# Patient Record
Sex: Female | Born: 1935 | Race: White | Hispanic: No | Smoking: Current some day smoker
Health system: Southern US, Community
[De-identification: ages and names within clinical notes are randomized; demographics above are authoritative.]

## PROBLEM LIST (undated history)

## (undated) DIAGNOSIS — C069 Malignant neoplasm of mouth, unspecified: Secondary | ICD-10-CM

## (undated) DIAGNOSIS — I10 Essential (primary) hypertension: Secondary | ICD-10-CM

## (undated) HISTORY — PX: APPENDECTOMY: SHX54

## (undated) HISTORY — PX: TONSILLECTOMY: SUR1361

---

## 1997-06-08 ENCOUNTER — Ambulatory Visit (HOSPITAL_COMMUNITY): Admission: RE | Admit: 1997-06-08 | Discharge: 1997-06-08 | Payer: Self-pay | Admitting: *Deleted

## 1997-07-06 ENCOUNTER — Ambulatory Visit (HOSPITAL_COMMUNITY): Admission: RE | Admit: 1997-07-06 | Discharge: 1997-07-06 | Payer: Self-pay | Admitting: Cardiology

## 1998-09-27 ENCOUNTER — Other Ambulatory Visit: Admission: RE | Admit: 1998-09-27 | Discharge: 1998-09-27 | Payer: Self-pay | Admitting: Family Medicine

## 2002-05-25 ENCOUNTER — Encounter: Payer: Self-pay | Admitting: Family Medicine

## 2002-05-25 ENCOUNTER — Encounter: Admission: RE | Admit: 2002-05-25 | Discharge: 2002-05-25 | Payer: Self-pay | Admitting: Family Medicine

## 2003-11-27 ENCOUNTER — Ambulatory Visit: Payer: Self-pay | Admitting: Family Medicine

## 2004-03-05 ENCOUNTER — Ambulatory Visit: Payer: Self-pay | Admitting: Family Medicine

## 2004-04-30 ENCOUNTER — Ambulatory Visit: Payer: Self-pay | Admitting: Family Medicine

## 2004-08-06 ENCOUNTER — Ambulatory Visit: Payer: Self-pay | Admitting: Family Medicine

## 2004-11-13 ENCOUNTER — Ambulatory Visit: Payer: Self-pay | Admitting: Family Medicine

## 2005-02-20 ENCOUNTER — Ambulatory Visit: Payer: Self-pay | Admitting: Family Medicine

## 2005-08-05 ENCOUNTER — Ambulatory Visit: Payer: Self-pay | Admitting: Family Medicine

## 2005-11-12 ENCOUNTER — Ambulatory Visit: Payer: Self-pay | Admitting: Family Medicine

## 2006-02-19 ENCOUNTER — Ambulatory Visit: Payer: Self-pay | Admitting: Family Medicine

## 2006-05-28 ENCOUNTER — Ambulatory Visit: Payer: Self-pay | Admitting: Family Medicine

## 2015-09-20 ENCOUNTER — Emergency Department (HOSPITAL_COMMUNITY): Payer: Medicare Other

## 2015-09-20 ENCOUNTER — Encounter (HOSPITAL_COMMUNITY): Payer: Self-pay | Admitting: Emergency Medicine

## 2015-09-20 ENCOUNTER — Inpatient Hospital Stay (HOSPITAL_COMMUNITY): Payer: Medicare Other

## 2015-09-20 ENCOUNTER — Inpatient Hospital Stay (HOSPITAL_COMMUNITY)
Admission: EM | Admit: 2015-09-20 | Discharge: 2015-09-25 | DRG: 871 | Disposition: A | Discharge status: Home / Self Care | Payer: Medicare Other | Attending: Internal Medicine | Admitting: Internal Medicine

## 2015-09-20 DIAGNOSIS — R7881 Bacteremia: Secondary | ICD-10-CM | POA: Diagnosis not present

## 2015-09-20 DIAGNOSIS — Z79899 Other long term (current) drug therapy: Secondary | ICD-10-CM | POA: Diagnosis not present

## 2015-09-20 DIAGNOSIS — Z888 Allergy status to other drugs, medicaments and biological substances status: Secondary | ICD-10-CM

## 2015-09-20 DIAGNOSIS — K838 Other specified diseases of biliary tract: Secondary | ICD-10-CM | POA: Diagnosis not present

## 2015-09-20 DIAGNOSIS — J9601 Acute respiratory failure with hypoxia: Secondary | ICD-10-CM | POA: Diagnosis present

## 2015-09-20 DIAGNOSIS — C801 Malignant (primary) neoplasm, unspecified: Secondary | ICD-10-CM

## 2015-09-20 DIAGNOSIS — L8990 Pressure ulcer of unspecified site, unspecified stage: Secondary | ICD-10-CM | POA: Diagnosis present

## 2015-09-20 DIAGNOSIS — D72829 Elevated white blood cell count, unspecified: Secondary | ICD-10-CM

## 2015-09-20 DIAGNOSIS — Z882 Allergy status to sulfonamides status: Secondary | ICD-10-CM | POA: Diagnosis not present

## 2015-09-20 DIAGNOSIS — E43 Unspecified severe protein-calorie malnutrition: Secondary | ICD-10-CM | POA: Diagnosis present

## 2015-09-20 DIAGNOSIS — E039 Hypothyroidism, unspecified: Secondary | ICD-10-CM | POA: Diagnosis present

## 2015-09-20 DIAGNOSIS — B9620 Unspecified Escherichia coli [E. coli] as the cause of diseases classified elsewhere: Secondary | ICD-10-CM | POA: Diagnosis present

## 2015-09-20 DIAGNOSIS — I5189 Other ill-defined heart diseases: Secondary | ICD-10-CM | POA: Diagnosis present

## 2015-09-20 DIAGNOSIS — N179 Acute kidney failure, unspecified: Secondary | ICD-10-CM | POA: Diagnosis present

## 2015-09-20 DIAGNOSIS — K8020 Calculus of gallbladder without cholecystitis without obstruction: Secondary | ICD-10-CM | POA: Diagnosis present

## 2015-09-20 DIAGNOSIS — R7989 Other specified abnormal findings of blood chemistry: Secondary | ICD-10-CM | POA: Diagnosis present

## 2015-09-20 DIAGNOSIS — Z85810 Personal history of malignant neoplasm of tongue: Secondary | ICD-10-CM

## 2015-09-20 DIAGNOSIS — A419 Sepsis, unspecified organism: Secondary | ICD-10-CM | POA: Diagnosis present

## 2015-09-20 DIAGNOSIS — R918 Other nonspecific abnormal finding of lung field: Secondary | ICD-10-CM | POA: Diagnosis not present

## 2015-09-20 DIAGNOSIS — E119 Type 2 diabetes mellitus without complications: Secondary | ICD-10-CM | POA: Diagnosis present

## 2015-09-20 DIAGNOSIS — Z923 Personal history of irradiation: Secondary | ICD-10-CM | POA: Diagnosis not present

## 2015-09-20 DIAGNOSIS — Z87891 Personal history of nicotine dependence: Secondary | ICD-10-CM

## 2015-09-20 DIAGNOSIS — I10 Essential (primary) hypertension: Secondary | ICD-10-CM | POA: Diagnosis present

## 2015-09-20 DIAGNOSIS — N390 Urinary tract infection, site not specified: Secondary | ICD-10-CM | POA: Diagnosis present

## 2015-09-20 DIAGNOSIS — Z23 Encounter for immunization: Secondary | ICD-10-CM

## 2015-09-20 DIAGNOSIS — Z681 Body mass index (BMI) 19 or less, adult: Secondary | ICD-10-CM | POA: Diagnosis not present

## 2015-09-20 DIAGNOSIS — K7689 Other specified diseases of liver: Secondary | ICD-10-CM | POA: Diagnosis present

## 2015-09-20 DIAGNOSIS — I700 Atherosclerosis of aorta: Secondary | ICD-10-CM

## 2015-09-20 DIAGNOSIS — R0600 Dyspnea, unspecified: Secondary | ICD-10-CM | POA: Diagnosis not present

## 2015-09-20 DIAGNOSIS — I959 Hypotension, unspecified: Secondary | ICD-10-CM | POA: Diagnosis present

## 2015-09-20 DIAGNOSIS — R64 Cachexia: Secondary | ICD-10-CM | POA: Diagnosis present

## 2015-09-20 DIAGNOSIS — I480 Paroxysmal atrial fibrillation: Secondary | ICD-10-CM

## 2015-09-20 DIAGNOSIS — K7460 Unspecified cirrhosis of liver: Secondary | ICD-10-CM

## 2015-09-20 DIAGNOSIS — J189 Pneumonia, unspecified organism: Secondary | ICD-10-CM | POA: Diagnosis present

## 2015-09-20 DIAGNOSIS — C3490 Malignant neoplasm of unspecified part of unspecified bronchus or lung: Secondary | ICD-10-CM

## 2015-09-20 DIAGNOSIS — R0902 Hypoxemia: Secondary | ICD-10-CM

## 2015-09-20 HISTORY — DX: Malignant neoplasm of mouth, unspecified: C06.9

## 2015-09-20 HISTORY — DX: Essential (primary) hypertension: I10

## 2015-09-20 LAB — CBC WITH DIFFERENTIAL/PLATELET
BASOS ABS: 0 10*3/uL (ref 0.0–0.1)
BASOS PCT: 0 %
Eosinophils Absolute: 0 10*3/uL (ref 0.0–0.7)
Eosinophils Relative: 0 %
HEMATOCRIT: 46.3 % — AB (ref 36.0–46.0)
HEMOGLOBIN: 15.2 g/dL — AB (ref 12.0–15.0)
Lymphocytes Relative: 4 %
Lymphs Abs: 0.6 10*3/uL — ABNORMAL LOW (ref 0.7–4.0)
MCH: 30.7 pg (ref 26.0–34.0)
MCHC: 32.8 g/dL (ref 30.0–36.0)
MCV: 93.5 fL (ref 78.0–100.0)
MONOS PCT: 6 %
Monocytes Absolute: 0.8 10*3/uL (ref 0.1–1.0)
NEUTROS ABS: 12.2 10*3/uL — AB (ref 1.7–7.7)
NEUTROS PCT: 90 %
Platelets: 171 10*3/uL (ref 150–400)
RBC: 4.95 MIL/uL (ref 3.87–5.11)
RDW: 13.9 % (ref 11.5–15.5)
WBC: 13.6 10*3/uL — AB (ref 4.0–10.5)

## 2015-09-20 LAB — I-STAT VENOUS BLOOD GAS, ED
Acid-Base Excess: 7 mmol/L — ABNORMAL HIGH (ref 0.0–2.0)
Bicarbonate: 33.8 mmol/L — ABNORMAL HIGH (ref 20.0–28.0)
O2 Saturation: 38 %
PCO2 VEN: 52.8 mmHg (ref 44.0–60.0)
PH VEN: 7.414 (ref 7.250–7.430)
TCO2: 35 mmol/L (ref 0–100)
pO2, Ven: 23 mmHg — CL (ref 32.0–45.0)

## 2015-09-20 LAB — COMPREHENSIVE METABOLIC PANEL
ALK PHOS: 74 U/L (ref 38–126)
ALT: 13 U/L — ABNORMAL LOW (ref 14–54)
ANION GAP: 9 (ref 5–15)
AST: 20 U/L (ref 15–41)
Albumin: 2.8 g/dL — ABNORMAL LOW (ref 3.5–5.0)
BUN: 58 mg/dL — ABNORMAL HIGH (ref 6–20)
CALCIUM: 9.8 mg/dL (ref 8.9–10.3)
CHLORIDE: 99 mmol/L — AB (ref 101–111)
CO2: 31 mmol/L (ref 22–32)
Creatinine, Ser: 1.3 mg/dL — ABNORMAL HIGH (ref 0.44–1.00)
GFR calc non Af Amer: 38 mL/min — ABNORMAL LOW (ref >60)
GFR, EST AFRICAN AMERICAN: 44 mL/min — AB (ref >60)
Glucose, Bld: 112 mg/dL — ABNORMAL HIGH (ref 65–99)
Potassium: 3.6 mmol/L (ref 3.5–5.1)
SODIUM: 139 mmol/L (ref 135–145)
Total Bilirubin: 0.7 mg/dL (ref 0.3–1.2)
Total Protein: 7.2 g/dL (ref 6.5–8.1)

## 2015-09-20 LAB — URINALYSIS, ROUTINE W REFLEX MICROSCOPIC
Bilirubin Urine: NEGATIVE
Glucose, UA: NEGATIVE mg/dL
Ketones, ur: NEGATIVE mg/dL
NITRITE: NEGATIVE
PH: 6.5 (ref 5.0–8.0)
Protein, ur: 30 mg/dL — AB
SPECIFIC GRAVITY, URINE: 1.025 (ref 1.005–1.030)

## 2015-09-20 LAB — I-STAT TROPONIN, ED: TROPONIN I, POC: 0.03 ng/mL (ref 0.00–0.08)

## 2015-09-20 LAB — D-DIMER, QUANTITATIVE (NOT AT ARMC): D DIMER QUANT: 4.3 ug{FEU}/mL — AB (ref 0.00–0.50)

## 2015-09-20 LAB — URINE MICROSCOPIC-ADD ON

## 2015-09-20 LAB — PROCALCITONIN: Procalcitonin: 3.38 ng/mL

## 2015-09-20 LAB — I-STAT CG4 LACTIC ACID, ED
LACTIC ACID, VENOUS: 1.52 mmol/L (ref 0.5–1.9)
Lactic Acid, Venous: 2.55 mmol/L (ref 0.5–1.9)

## 2015-09-20 LAB — STREP PNEUMONIAE URINARY ANTIGEN: STREP PNEUMO URINARY ANTIGEN: NEGATIVE

## 2015-09-20 LAB — BRAIN NATRIURETIC PEPTIDE: B NATRIURETIC PEPTIDE 5: 441 pg/mL — AB (ref 0.0–100.0)

## 2015-09-20 MED ORDER — DEXTROSE 5 % IV SOLN: 1.0000 g | INTRAVENOUS | Status: DC

## 2015-09-20 MED ORDER — SODIUM CHLORIDE 0.9 % IV BOLUS (SEPSIS)
1000.0000 mL | Freq: Once | INTRAVENOUS | Status: AC
  Administered 2015-09-20: 1000 mL via INTRAVENOUS

## 2015-09-20 MED ORDER — DEXTROSE 5 % IV SOLN
1.0000 g | Freq: Once | INTRAVENOUS | Status: AC
  Administered 2015-09-20: 1 g via INTRAVENOUS
  Filled 2015-09-20: qty 10

## 2015-09-20 MED ORDER — SODIUM CHLORIDE 0.9 % IV BOLUS (SEPSIS)
500.0000 mL | Freq: Once | INTRAVENOUS | Status: AC
  Administered 2015-09-20: 500 mL via INTRAVENOUS

## 2015-09-20 MED ORDER — DEXTROSE 5 % IV SOLN: 500.0000 mg | INTRAVENOUS | Status: DC

## 2015-09-20 MED ORDER — DEXTROSE 5 % IV SOLN
500.0000 mg | Freq: Once | INTRAVENOUS | Status: AC
  Administered 2015-09-20: 500 mg via INTRAVENOUS
  Filled 2015-09-20: qty 500

## 2015-09-20 NOTE — H&P (Signed)
Date: 09/20/2015               Patient Name:  Danielle Mcintyre MRN: [id number]  DOB: 13-Sep-1934 Age / Sex: 81 y.o., female   PCP: Dione Housekeeper, MD         Medical Service: Internal Medicine Teaching Service         Attending Physician: Dr. Lucious Groves, DO    First Contact: Dr. Ledell Noss Pager: [phone]  Second Contact: Dr. Julious Oka Pager: [phone]       After Hours (After 5p/  First Contact Pager: [phone]  weekends / holidays): Second Contact Pager: [phone]   Chief Complaint: Respiratory distress  History of Present Illness: Danielle Mcintyre is a 81 year old female with a past medical history of hypertension, hypothyroidism and diet-controlled diabetes who presents from an outpatient clinic appointment with respiratory distress. The patient went to see Danielle Mcintyre earlier this afternoon for a routine clinic appointment. During that appointment the patient was found to be tachypnic, tachycardic, hypoxic and endorsing that she had a lot of sinus drainage. Pulse oximetry was done in the office and the patient was only satting 85% on room air. Additionally, physical examination as described from her physician was positive for coarse scattered rhonchi in the lower lung fields bilaterally. In the clinic the patient was put on 2 L of oxygen via nasal cannula and her oxygen saturation improved to 92%. From Danielle Mcintyre note the patient has also had a 30 pound weight loss over the last 4 months and has had decreased energy over this time period as well. EMS was called and she was transferred to Danielle Mcintyre. Speaking with the patient this evening she denied shortness of breath. The patient stated that she did not know why she was brought to the Mcintyre and said that she was feeling fine. On complete review of systems the patient denied headaches, changes in vision, shortness of breath, chest pain, palpitations, nausea/vomiting or abdominal pain. Additionally, the patient denied night sweats. She did  endorse weight loss although she was unsure how much weight she had lost in the previous 4 months.  In the emergency department the patient was afebrile and in no acute distress. She was found to be hypotensive with a blood pressure of 86/41, tachypnic with a respiratory rate of 24 and hypoxic satting 75% while on room air. She was started on 3 L of oxygen via nasal cannula and was satting between 94-95%. Labs were significant for an elevated creatinine at 1.30, decreased albumin at 2.8 and an ALT of 13. CBC demonstrated leukocytosis with a white blood cell count of 13.6. Hemoglobin was slightly elevated at 15.2. A differential was ordered which demonstrated a neutrophilic leukocytosis. BNP was elevated at 441.0. A venous blood gas showed a PO2 of 23.0 and HCO3 of 33.8. Venous lactic acid was elevated at 2.55. A chest x-ray was performed which demonstrated pulmonary hyperinflation suspicious for COPD without any acute cardiopulmonary abnormalities. Blood cultures and urine cultures were also obtained. The patient was given 1.5 L of normal saline and was started on azithromycin and ceftriaxone for a potential community-acquired pneumonia.  Meds:  Current Meds  Medication Sig  . amLODipine (NORVASC) 5 MG tablet Take 5 mg by mouth daily.  Marland Kitchen guaiFENesin-codeine (ROBITUSSIN AC) 100-10 MG/5ML syrup Take 10 mLs by mouth 3 (three) times daily as needed for cough.  . levothyroxine (SYNTHROID, LEVOTHROID) 75 MCG tablet Take 75 mcg by mouth daily before breakfast.  .  metoprolol succinate (TOPROL-XL) 100 MG 24 hr tablet Take 100 mg by mouth daily.      Allergies: Allergies as of 09/20/2015 - Review Complete 09/20/2015  Allergen Reaction Noted  . Hydrochlorothiazide Rash 07/14/2013  . Sulfa antibiotics Hives, Itching, and Swelling 09/20/2015   Past Medical History:  Diagnosis Date  . Hypertension   . Oral cancer [facility])     Family History:  Myocardial infarction- father Ovarian cancer-mother  Social  History: She has had a long tobacco use history that she was unable to quantify but states she quit 2 years ago. She denies alcohol use.  Review of Systems: A complete ROS was negative except as per HPI.   Physical Exam: Blood pressure (!) 86/41, pulse 70, temperature 97.8 F (36.6 C), temperature source Oral, resp. rate 24, weight 91 lb (41.3 kg), SpO2 96 %. Physical Exam  Constitutional: She is oriented to person, place, and time. She appears well-developed.  In no acute distress, appears cachectic and emaciated  HENT:  Patient has a history of oral cancer she is status post hemiglossectomy and neck dissection. She has nasal cannula in place.  Cardiovascular: Normal rate and regular rhythm.  Exam reveals no gallop and no friction rub.   No murmur heard. Her cardiac examination demonstrated distant heart sounds which were difficult to auscultate  Respiratory: Effort normal and breath sounds normal. No respiratory distress. She has no wheezes. She has no rales.  The patient did not appear to be in respiratory distress. No crackles were appreciated. However, the patient had a difficult pulmonary examination and it appears as if she was not moving good volumes of air.  GI: Soft. Bowel sounds are normal. She exhibits no distension. There is no tenderness.  Musculoskeletal: She exhibits no edema.  Neurological: She is alert and oriented to person, place, and time.     EKG: No acute ST segment elevation  CXR: Hyperinflated lung fields suspicious for COPD. No acute cardiopulmonary abnormality. No focal infiltrate suggestive of pneumonia.  Assessment & Plan by Problem: Danielle Mcintyre is a pleasant 81 year old female with a past medical history of hypertension, hypothyroidism and diet-controlled diabetes who presents from an outpatient clinic with respiratory distress and a new oxygen requirement. The patient had a chest x-ray which did not demonstrate focal infiltrate. Her acute respiratory failure  is currently of unclear etiology and the differential diagnosis includes community-acquired pneumonia, atypical pneumonia, pulmonary embolism, COPD,congestive heart failure or other pulmonary process.  1. Acute hypoxic respiratory failure Patient with no known oxygen requirement. Currently on 2 L via nasal cannula satting 95%. Chest x-ray did not demonstrate focal infiltrate. BNP was elevated at 441.0. No history of heart failure. The differential diagnosis includes community acquired pneumonia, atypical pneumonia, pulmonary embolism, congestive heart failure, COPD or other pulmonary process. However, given no fever and lack of consolidation on chest x-ray without cough or sputum production pneumonia is less likely. -- Continue oxygen 2 L via nasal cannula -- D-dimer -- Procalcitonin  -- Discontinue antibiotics -- Follow-up blood cultures -- Follow-up urine culture -- Continuous pulse oximetry -- Daily weights  2. Acute kidney injury Patient has a baseline creatinine around 1.0. At time of admission patient's creatinine was 1.30. This acute kidney injury is most likely prerenal in etiology secondary to decreased by mouth intake over the previous several days. We will hydrate the patient and repeat lab work tomorrow to see if her creatinine has returned to baseline before further evaluating this slight increase. -- Given 1.5 L normal  saline fluid in the emergency department -- Repeat basic metabolic panel in the morning  3. Leukocytosis Patient with a white blood cell count of 13.6. This elevation may be secondary to possible infection including pneumonia or urinary tract infection. Blood cultures and urine cultures have been obtained. Patient not complaining of any symptoms suggesting underlying infection. -- Follow-up blood cultures -- Follow-up urine culture -- Repeat CBC in the morning  4. Weight loss Per the patient's primary care physician she has had a 30 pound weight loss over the  last 4 months. She appears extremely cachectic and frail on physical examination. Patient does have a history of underlying malignancy. This weight loss may be secondary to potential malignancy or from decreased by mouth intake and overall decline. -- Full diet -- Consider outpatient workup for potential underlying malignancy  5. Hypotension Patient was hypotensive in the emergency department and the referring clinic. Her most recent blood pressure was 109/51. -- Hold home amlodipine -- Given 1.5 L normal saline in the emergency department  6. Hypothyroidism -- Resume home Synthroid 75 MCG once daily   7. DVT/PE prophylaxis --Heparin 5000 units subcutaneous injection every 8 hours   Dispo: Admit patient to Observation with expected length of stay less than 2 midnights.  Signed: Ophelia Shoulder, MD 09/20/2015, 8:57 PM  Pager: [phone]

## 2015-09-20 NOTE — ED Triage Notes (Signed)
Pt went to MD office wanting cough medication. BP 80/40 at office. EMS reports rhonchi upon assessment. Questionable pneumonia. Room air 90%. 95% on 2L Danielle Mcintyre, BP 106/59, HR 78, resp 18.

## 2015-09-20 NOTE — ED Provider Notes (Signed)
[facility] DEPT Provider Note   CSN: [id number] Arrival date & time: 09/20/15  1543     History   Chief Complaint Chief Complaint  Patient presents with  . Hypotension  . Shortness of Breath    HPI Danielle Mcintyre is a 81 y.o. female.  The history is provided by the patient.    81 yo F with PMHx of oral cancer who p/w SOB, cough, confusion. Per report from pt and family, pt has had increasingly productive cough over past week. She has had intermittent SOB as well. She has also had chills but no fevers, decreasing appetite, and some increased confusion according to the family. No LE edema, orthopnea, or PND. No chest pain. No known sick contacts. She went to her PCP today for her cough when she was noted to be hypotensive to 80/40 with O2 sats in mid 70s. Brought here immediately for evaluation.  Past Medical History:  Diagnosis Date  . Hypertension   . Oral cancer [facility])     Patient Active Problem List   Diagnosis Date Noted  . CAP (community acquired pneumonia) 09/20/2015    Past Surgical History:  Procedure Laterality Date  . APPENDECTOMY    . TONSILLECTOMY      OB History    No data available       Home Medications    Prior to Admission medications   Medication Sig Start Date End Date Taking? Authorizing Provider  amLODipine (NORVASC) 5 MG tablet Take 5 mg by mouth daily.   Yes Historical Provider, MD  guaiFENesin-codeine (ROBITUSSIN AC) 100-10 MG/5ML syrup Take 10 mLs by mouth 3 (three) times daily as needed for cough.   Yes Historical Provider, MD  levothyroxine (SYNTHROID, LEVOTHROID) 75 MCG tablet Take 75 mcg by mouth daily before breakfast.   Yes Historical Provider, MD  metoprolol succinate (TOPROL-XL) 100 MG 24 hr tablet Take 100 mg by mouth daily.    Yes Historical Provider, MD    Family History History reviewed. No pertinent family history.  Social History Social History  Substance Use Topics  . Smoking status: Current Some Day Smoker  .  Smokeless tobacco: Never Used  . Alcohol use No     Allergies   Hydrochlorothiazide and Sulfa antibiotics   Review of Systems Review of Systems  Constitutional: Positive for chills and fatigue. Negative for fever.  HENT: Negative for congestion, rhinorrhea and sore throat.   Eyes: Negative for visual disturbance.  Respiratory: Positive for cough and shortness of breath. Negative for wheezing.   Cardiovascular: Negative for chest pain and leg swelling.  Gastrointestinal: Negative for abdominal pain, diarrhea, nausea and vomiting.  Genitourinary: Negative for dysuria, flank pain, vaginal bleeding and vaginal discharge.  Musculoskeletal: Negative for neck pain.  Skin: Negative for rash.  Allergic/Immunologic: Negative for immunocompromised state.  Neurological: Negative for syncope and headaches.  Hematological: Does not bruise/bleed easily.  All other systems reviewed and are negative.    Physical Exam Updated Vital Signs BP 106/73   Pulse 64   Temp 97.6 F (36.4 C) (Oral)   Resp 22   Ht 5' (1.524 m)   Wt 95 lb 7.4 oz (43.3 kg)   SpO2 93%   BMI 18.64 kg/m   Physical Exam  Constitutional: She appears well-developed.  Cachectic, chronically ill appearing  HENT:  Mouth/Throat: No oropharyngeal exudate.  S/p radial neck and mandibular resection right jaw.  Eyes: Conjunctivae are normal. Pupils are equal, round, and reactive to light.  Neck: Neck supple.  Cardiovascular: Normal rate, regular rhythm and normal heart sounds.   No murmur heard. Pulmonary/Chest: No accessory muscle usage. Tachypnea noted. She has decreased breath sounds. She has no wheezes. She has rhonchi in the right lower field and the left lower field.  Abdominal: Soft. She exhibits no distension. There is no tenderness.  Musculoskeletal: She exhibits no edema.  Neurological: She is alert. She exhibits normal muscle tone.  Skin: Skin is warm. Capillary refill takes less than 2 seconds. No rash noted.    Nursing note and vitals reviewed.    ED Treatments / Results  Labs (all labs ordered are listed, but only abnormal results are displayed) Labs Reviewed  COMPREHENSIVE METABOLIC PANEL - Abnormal; Notable for the following:       Result Value   Chloride 99 (*)    Glucose, Bld 112 (*)    BUN 58 (*)    Creatinine, Ser 1.30 (*)    Albumin 2.8 (*)    ALT 13 (*)    GFR calc non Af Amer 38 (*)    GFR calc Af Amer 44 (*)    All other components within normal limits  CBC WITH DIFFERENTIAL/PLATELET - Abnormal; Notable for the following:    WBC 13.6 (*)    Hemoglobin 15.2 (*)    HCT 46.3 (*)    Neutro Abs 12.2 (*)    Lymphs Abs 0.6 (*)    All other components within normal limits  URINALYSIS, ROUTINE W REFLEX MICROSCOPIC (NOT AT [facility]) - Abnormal; Notable for the following:    Color, Urine AMBER (*)    APPearance CLOUDY (*)    Hgb urine dipstick MODERATE (*)    Protein, ur 30 (*)    Leukocytes, UA LARGE (*)    All other components within normal limits  BRAIN NATRIURETIC PEPTIDE - Abnormal; Notable for the following:    B Natriuretic Peptide 441.0 (*)    All other components within normal limits  D-DIMER, QUANTITATIVE (NOT AT [facility]) - Abnormal; Notable for the following:    D-Dimer, Quant 4.30 (*)    All other components within normal limits  URINE MICROSCOPIC-ADD ON - Abnormal; Notable for the following:    Squamous Epithelial / LPF 0-5 (*)    Bacteria, UA FEW (*)    Casts HYALINE CASTS (*)    All other components within normal limits  I-STAT CG4 LACTIC ACID, ED - Abnormal; Notable for the following:    Lactic Acid, Venous 2.55 (*)    All other components within normal limits  I-STAT VENOUS BLOOD GAS, ED - Abnormal; Notable for the following:    pO2, Ven 23.0 (*)    Bicarbonate 33.8 (*)    Acid-Base Excess 7.0 (*)    All other components within normal limits  CULTURE, BLOOD (ROUTINE X 2)  CULTURE, BLOOD (ROUTINE X 2)  URINE CULTURE  MRSA PCR SCREENING  PROCALCITONIN   STREP PNEUMONIAE URINARY ANTIGEN  CBC  BASIC METABOLIC PANEL  LEGIONELLA PNEUMOPHILA SEROGP 1 UR AG  I-STAT TROPOININ, ED  I-STAT CG4 LACTIC ACID, ED    EKG  EKG Interpretation  Date/Time:  Thursday September 20 2015 16:22:10 EDT Ventricular Rate:  77 PR Interval:    QRS Duration: 114 QT Interval:  422 QTC Calculation: 478 R Axis:   102 Text Interpretation:  Sinus rhythm Borderline short PR interval IRBBB and LPFB No old tracings to compare Non-specific ST-t changes Confirmed by Ellender Hose MD, Jaki Steptoe [phone]) on 09/20/2015 4:25:09 PM  Radiology Dg Chest Port 1 View  Result Date: 09/20/2015 CLINICAL DATA:  Rhonchi on examination. Questionable pneumonia. Mild hypoxia. Hypotension. EXAM: PORTABLE CHEST 1 VIEW COMPARISON:  08/20/2015 FINDINGS: The cardiomediastinal silhouette is unchanged. Thoracic aortic atherosclerosis is again noted. The lungs remain hyperinflated with chronic interstitial coarsening. No confluent airspace opacity, overt pulmonary edema, pleural effusion, or pneumothorax is identified. An old posterior left seventh rib fracture is again noted. IMPRESSION: 1. Unchanged appearance of the chest with findings suggestive of COPD. 2. Aortic atherosclerosis. Electronically Signed   By: Logan Bores M.D.   On: 09/20/2015 21:18   Dg Chest Port 1 View  Result Date: 09/20/2015 CLINICAL DATA:  Sepsis.  Hypertension. EXAM: PORTABLE CHEST 1 VIEW COMPARISON:  None. FINDINGS: The heart size and mediastinal contours are within normal limits. Aortic atherosclerosis. Pulmonary hyperinflation noted. Both lungs are clear. No pneumothorax or pleural effusion visualized. Old fracture deformity of left posterior seventh rib noted. IMPRESSION: Pulmonary hyperinflation, suspicious for COPD. No active lung disease. Aortic atherosclerosis. Electronically Signed   By: Earle Gell M.D.   On: 09/20/2015 17:21    Procedures .Critical Care Performed by: Duffy Bruce Authorized by: Duffy Bruce   Critical care provider statement:    Critical care time (minutes):  35   Critical care time was exclusive of:  Teaching time and separately billable procedures and treating other patients   Critical care was necessary to treat or prevent imminent or life-threatening deterioration of the following conditions:  Respiratory failure, sepsis, shock and circulatory failure   Critical care was time spent personally by me on the following activities:  Development of treatment plan with patient or surrogate, discussions with consultants, discussions with primary provider, ordering and performing treatments and interventions, ordering and review of laboratory studies, pulse oximetry, ordering and review of radiographic studies, re-evaluation of patient's condition, review of old charts and obtaining history from patient or surrogate   I assumed direction of critical care for this patient from another provider in my specialty: no     (including critical care time)  Medications Ordered in ED Medications  levothyroxine (SYNTHROID, LEVOTHROID) tablet 75 mcg (not administered)  heparin injection 5,000 Units (not administered)  sodium chloride 0.9 % bolus 1,000 mL (0 mLs Intravenous Stopped 09/20/15 1847)    And  sodium chloride 0.9 % bolus 500 mL (0 mLs Intravenous Stopped 09/20/15 2221)  cefTRIAXone (ROCEPHIN) 1 g in dextrose 5 % 50 mL IVPB (0 g Intravenous Stopped 09/20/15 1741)  azithromycin (ZITHROMAX) 500 mg in dextrose 5 % 250 mL IVPB (0 mg Intravenous Stopped 09/20/15 2221)     Initial Impression / Assessment and Plan / ED Course  I have reviewed the triage vital signs and the nursing notes.  Pertinent labs & imaging results that were available during my care of the patient were reviewed by me and considered in my medical decision making (see chart for details).  Clinical Course    81 yo F with PMHx of oral CA who p/w hypoxia, cough, and mild confusion. On arrival, pt mildly hypotensive,  hypoxic to 70s on RA with increased WOB, bilateral rhonchi. History, exam is most c/w acute hypoxic respiratory failure 2/2 CAP. No recent hospitalization or ABX use. No LE edema or signs of DVT/PE but given CA history, this is on DDx. Do not feel pt would tolerate CT at this time given WOB. Otherwise, given hypotension and c/f PNA, will initiate code sepsis with 30 cc/kg fluids, Rocephin/Azithro, and f/u imaging.  CXR clear -  suspect this is likely to develop into PNA once pt fluid resuscitated. CBC shows leukocytosis of 13.6k, CMP with mild AKI, and lactate elevated at 2.55. Will continue broad-spectrum ABX, fluid resuscitation, and admit to Step Down unit for hypoxic resp failure 2/2 suspected CAP versus CHF/PE, which they will work-up further. Pt in agreement. EKG non-ischemic and initial trop is negative.  Final Clinical Impressions(s) / ED Diagnoses   Final diagnoses:  Dyspnea  Sepsis, due to unspecified organism ([place])  CAP (community acquired pneumonia)  Elevated lactic acid level  Leukocytosis  Acute respiratory failure with hypoxia [facility])    New Prescriptions Current Discharge Medication List       Duffy Bruce, MD 09/21/15 [phone]

## 2015-09-21 ENCOUNTER — Encounter (HOSPITAL_COMMUNITY): Payer: Self-pay | Admitting: *Deleted

## 2015-09-21 ENCOUNTER — Inpatient Hospital Stay (HOSPITAL_COMMUNITY): Payer: Medicare Other

## 2015-09-21 DIAGNOSIS — Z9049 Acquired absence of other specified parts of digestive tract: Secondary | ICD-10-CM

## 2015-09-21 DIAGNOSIS — Z87891 Personal history of nicotine dependence: Secondary | ICD-10-CM

## 2015-09-21 DIAGNOSIS — Z79899 Other long term (current) drug therapy: Secondary | ICD-10-CM

## 2015-09-21 DIAGNOSIS — B9620 Unspecified Escherichia coli [E. coli] as the cause of diseases classified elsewhere: Secondary | ICD-10-CM

## 2015-09-21 DIAGNOSIS — Z8041 Family history of malignant neoplasm of ovary: Secondary | ICD-10-CM

## 2015-09-21 DIAGNOSIS — E43 Unspecified severe protein-calorie malnutrition: Secondary | ICD-10-CM | POA: Diagnosis present

## 2015-09-21 DIAGNOSIS — Z8249 Family history of ischemic heart disease and other diseases of the circulatory system: Secondary | ICD-10-CM

## 2015-09-21 DIAGNOSIS — Z85810 Personal history of malignant neoplasm of tongue: Secondary | ICD-10-CM

## 2015-09-21 DIAGNOSIS — N179 Acute kidney failure, unspecified: Secondary | ICD-10-CM

## 2015-09-21 DIAGNOSIS — L8990 Pressure ulcer of unspecified site, unspecified stage: Secondary | ICD-10-CM | POA: Diagnosis present

## 2015-09-21 DIAGNOSIS — Z888 Allergy status to other drugs, medicaments and biological substances status: Secondary | ICD-10-CM

## 2015-09-21 DIAGNOSIS — N390 Urinary tract infection, site not specified: Secondary | ICD-10-CM

## 2015-09-21 DIAGNOSIS — D72829 Elevated white blood cell count, unspecified: Secondary | ICD-10-CM

## 2015-09-21 DIAGNOSIS — I959 Hypotension, unspecified: Secondary | ICD-10-CM

## 2015-09-21 DIAGNOSIS — Z882 Allergy status to sulfonamides status: Secondary | ICD-10-CM

## 2015-09-21 DIAGNOSIS — E039 Hypothyroidism, unspecified: Secondary | ICD-10-CM

## 2015-09-21 DIAGNOSIS — J9601 Acute respiratory failure with hypoxia: Secondary | ICD-10-CM

## 2015-09-21 LAB — BASIC METABOLIC PANEL
ANION GAP: 8 (ref 5–15)
ANION GAP: 7 (ref 5–15)
BUN: 45 mg/dL — ABNORMAL HIGH (ref 6–20)
BUN: 44 mg/dL — AB (ref 6–20)
CHLORIDE: 102 mmol/L (ref 101–111)
CO2: 29 mmol/L (ref 22–32)
CO2: 28 mmol/L (ref 22–32)
Calcium: 8.7 mg/dL — ABNORMAL LOW (ref 8.9–10.3)
Calcium: 9 mg/dL (ref 8.9–10.3)
Chloride: 104 mmol/L (ref 101–111)
Creatinine, Ser: 0.91 mg/dL (ref 0.44–1.00)
Creatinine, Ser: 0.84 mg/dL (ref 0.44–1.00)
GFR calc non Af Amer: 58 mL/min — ABNORMAL LOW (ref >60)
Glucose, Bld: 86 mg/dL (ref 65–99)
Glucose, Bld: 86 mg/dL (ref 65–99)
POTASSIUM: 3.6 mmol/L (ref 3.5–5.1)
POTASSIUM: 3.5 mmol/L (ref 3.5–5.1)
SODIUM: 139 mmol/L (ref 135–145)
SODIUM: 139 mmol/L (ref 135–145)

## 2015-09-21 LAB — BLOOD CULTURE ID PANEL (REFLEXED)
Acinetobacter baumannii: NOT DETECTED
CANDIDA GLABRATA: NOT DETECTED
Candida albicans: NOT DETECTED
Candida krusei: NOT DETECTED
Candida parapsilosis: NOT DETECTED
Candida tropicalis: NOT DETECTED
Carbapenem resistance: NOT DETECTED
ENTEROBACTER CLOACAE COMPLEX: NOT DETECTED
ENTEROBACTERIACEAE SPECIES: DETECTED — AB
ENTEROCOCCUS SPECIES: NOT DETECTED
ESCHERICHIA COLI: DETECTED — AB
Haemophilus influenzae: NOT DETECTED
Klebsiella oxytoca: NOT DETECTED
Klebsiella pneumoniae: NOT DETECTED
LISTERIA MONOCYTOGENES: NOT DETECTED
NEISSERIA MENINGITIDIS: NOT DETECTED
PSEUDOMONAS AERUGINOSA: NOT DETECTED
Proteus species: NOT DETECTED
SERRATIA MARCESCENS: NOT DETECTED
STAPHYLOCOCCUS SPECIES: NOT DETECTED
STREPTOCOCCUS AGALACTIAE: NOT DETECTED
STREPTOCOCCUS PNEUMONIAE: NOT DETECTED
Staphylococcus aureus (BCID): NOT DETECTED
Streptococcus pyogenes: NOT DETECTED
Streptococcus species: DETECTED — AB

## 2015-09-21 LAB — CBC
HCT: 40.1 % (ref 36.0–46.0)
HCT: 41.6 % (ref 36.0–46.0)
HEMOGLOBIN: 13 g/dL (ref 12.0–15.0)
HEMOGLOBIN: 13.6 g/dL (ref 12.0–15.0)
MCH: 30 pg (ref 26.0–34.0)
MCH: 30.4 pg (ref 26.0–34.0)
MCHC: 32.4 g/dL (ref 30.0–36.0)
MCHC: 32.7 g/dL (ref 30.0–36.0)
MCV: 92.6 fL (ref 78.0–100.0)
MCV: 92.9 fL (ref 78.0–100.0)
PLATELETS: 124 10*3/uL — AB (ref 150–400)
Platelets: 111 10*3/uL — ABNORMAL LOW (ref 150–400)
RBC: 4.33 MIL/uL (ref 3.87–5.11)
RBC: 4.48 MIL/uL (ref 3.87–5.11)
RDW: 14 % (ref 11.5–15.5)
RDW: 14 % (ref 11.5–15.5)
WBC: 9.2 10*3/uL (ref 4.0–10.5)
WBC: 10.4 10*3/uL (ref 4.0–10.5)

## 2015-09-21 LAB — MRSA PCR SCREENING: MRSA BY PCR: NEGATIVE

## 2015-09-21 MED ORDER — LEVOTHYROXINE SODIUM 75 MCG PO TABS
75.0000 ug | ORAL_TABLET | Freq: Every day | ORAL | Status: DC
  Administered 2015-09-21 – 2015-09-25 (×5): 75 ug via ORAL
  Filled 2015-09-21 (×5): qty 1

## 2015-09-21 MED ORDER — ENSURE ENLIVE PO LIQD
237.0000 mL | Freq: Three times a day (TID) | ORAL | Status: DC
  Administered 2015-09-21 – 2015-09-25 (×8): 237 mL via ORAL

## 2015-09-21 MED ORDER — HEPARIN SODIUM (PORCINE) 5000 UNIT/ML IJ SOLN
5000.0000 [IU] | Freq: Three times a day (TID) | INTRAMUSCULAR | Status: DC
  Administered 2015-09-21: 5000 [IU] via SUBCUTANEOUS
  Filled 2015-09-21: qty 1

## 2015-09-21 MED ORDER — SODIUM CHLORIDE 0.9 % IV SOLN
INTRAVENOUS | Status: AC
  Administered 2015-09-21: 14:00:00 via INTRAVENOUS

## 2015-09-21 MED ORDER — DEXTROSE 5 % IV SOLN
2.0000 g | INTRAVENOUS | Status: DC
  Administered 2015-09-21 – 2015-09-23 (×3): 2 g via INTRAVENOUS
  Filled 2015-09-21 (×4): qty 2

## 2015-09-21 MED ORDER — PNEUMOCOCCAL VAC POLYVALENT 25 MCG/0.5ML IJ INJ
0.5000 mL | INJECTION | INTRAMUSCULAR | Status: AC
  Administered 2015-09-24: 0.5 mL via INTRAMUSCULAR
  Filled 2015-09-21: qty 0.5

## 2015-09-21 MED ORDER — ALBUTEROL SULFATE (2.5 MG/3ML) 0.083% IN NEBU
2.5000 mg | INHALATION_SOLUTION | Freq: Four times a day (QID) | RESPIRATORY_TRACT | Status: DC | PRN
  Administered 2015-09-22: 2.5 mg via RESPIRATORY_TRACT
  Filled 2015-09-21: qty 3

## 2015-09-21 MED ORDER — ORAL CARE MOUTH RINSE
15.0000 mL | Freq: Two times a day (BID) | OROMUCOSAL | Status: DC
  Administered 2015-09-21 – 2015-09-25 (×7): 15 mL via OROMUCOSAL

## 2015-09-21 MED ORDER — IOPAMIDOL (ISOVUE-370) INJECTION 76%
INTRAVENOUS | Status: AC
  Administered 2015-09-21: 100 mL
  Filled 2015-09-21: qty 100

## 2015-09-21 MED ORDER — ENOXAPARIN SODIUM 30 MG/0.3ML ~~LOC~~ SOLN
30.0000 mg | SUBCUTANEOUS | Status: DC
  Administered 2015-09-21: 30 mg via SUBCUTANEOUS
  Filled 2015-09-21: qty 0.3

## 2015-09-21 NOTE — Progress Notes (Addendum)
   Subjective: This morning, she explained to [place] that she was not sure as to why she was sent to the hospital other than having her oxygen levels at a low level of her PCPs office. We reviewed with her and her daughter the workup that had been done thus far which did not point to a clear answer, and we recommended that we proceed with a CTA to better identify what could be causing her hypoxia.   She did disclose however that she has lost a significant amount of weight which we acknowledged with her concern for cancer. 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  Later this morning, 1 out of 2 blood cultures was notable for Streptococcus and Escherichia coli for which ceftriaxone was started.  Objective:  Vital signs in last 24 hours: Vitals:   09/21/15 0700 09/21/15 0803 09/21/15 0804 09/21/15 0821  BP: (!) 96/48     Mcintyre: (!) 54 64    Resp: (!) 38 19    Temp:    97.7 F (36.5 C)  TempSrc:    Oral  SpO2: 95% (!) 85% 90%   Weight:      Height:       Physical Exam  Constitutional: She is oriented to person, place, and time. No distress.  Chronically ill-appearing  HENT:  Head: Normocephalic and atraumatic.  Lower jaw absent  Eyes: Conjunctivae are normal. No scleral icterus.  Cardiovascular: Normal rate and regular rhythm.   Pulmonary/Chest:  Minimal air movement bilaterally in all lung fields  Abdominal: Soft. Bowel sounds are normal.  Neurological: She is alert and oriented to person, place, and time.  Skin: She is not diaphoretic.   Assessment/Plan:  Principal Problem:   Bacteremia Active Problems:   Protein-calorie malnutrition, severe   Pressure ulcer  Danielle Mcintyre is a 81 year old female with history of throat cancer status post XRT and radical neck dissection, hypothyroidism, hypertension, diet-controlled diabetes hospitalized for acute hypoxic respiratory failure found to have  bacteremia.  Bacteremia: Blood cultures 1 out of 2 notable for Escherichia coli and Streptococcus as noted above. Appears consistent with hypoxia and leukocytosis noted on admission. Urinalysis on admission did have bacteriuria though she did not have any urinary complaints which would be consistent with urinary tract infection but could account for the Escherichia coli. Unclear source for the Streptococcus. Received 1.5 L in the ED. -Continue supplemental oxygen -Continue ceftriaxone IV [facility] 2] -Start IV @ 75cc/hr   Prerenal acute kidney injury: Resolved. Creatinine 0.84, improved from 1.3 on admission.  Severe protein calorie malnutrition: 30 pounds over the last 4 months which is suspect for malignancy given prior smoking history. -Follow-up CTA -Consult dietitian for recommendations  Hypothyroidism: Continue home levothyroxine 75 g daily.  #FEN:  -Diet: Regular though pending Nutrition consult  #DVT prophylaxis: Lovenox  #CODE STATUS: FULL CODE -Defer to daughter Danielle Mcintyre [phone] if patients lacks decision-making capacity -Confirmed with patient on admission though will need to revisit in light of her poor functional status and overall goals of care  Dispo: Anticipated discharge in approximately 3-5 day(s).   Riccardo Dubin, MD 09/21/2015, 12:52 PM Pager: '@[email or web address]'$ @

## 2015-09-21 NOTE — Progress Notes (Signed)
Patient admitted from ED BP (!) 115/51   Pulse 74   Temp 97.6 F (36.4 C) (Oral)   Resp 20   Ht 5' (1.524 m)   Wt 43.3 kg (95 lb 7.4 oz)   SpO2 94%   BMI 18.64 kg/m  Patient and daughter oriented to room and environment.  Bedrails up x2, bed in lowest position, patient instructed how to use call bell and it was placed in reach.  Will continue to monitor.

## 2015-09-21 NOTE — Progress Notes (Signed)
Initial Nutrition Assessment  DOCUMENTATION CODES:   Severe malnutrition in context of chronic illness, Underweight  INTERVENTION:   Will order chocolate Ensure Enlive with chocolate ice cream TID between meals. Will modify diet order to Dysphagia 2.  NUTRITION DIAGNOSIS:   Malnutrition related to chronic illness as evidenced by severe depletion of body fat, severe depletion of muscle mass.  GOAL:   Patient will meet greater than or equal to 90% of their needs  MONITOR:   PO intake, Supplement acceptance, Weight trends  REASON FOR ASSESSMENT:   Consult, Malnutrition Screening Tool Assessment of nutrition requirement/status  ASSESSMENT:   Danielle Mcintyre is a 81 year old female with a past medical history of hypertension, hypothyroidism, and diet-controlled diabetes who presents from an outpatient clinic with respiratory distress and a new oxygen requirement. The patient had a chest x-ray which did not demonstrate focal infiltrate. Her acute respiratory failure is currently of unclear etiology and the differential diagnosis includes community-acquired pneumonia, atypical pneumonia, pulmonary embolism, COPD, congestive heart failure or other pulmonary process.  RD consulted twice by physicians for assessment of nutrition requirements/status. Pt currently receiving Ensure Enlive nutrition supplement TID between meals.  RD and dietetic intern spoke with pt and son at bedside. Pt was consuming her breakfast at time of visit. Son was cutting the food into small pieces. Pt reports preparing her own food at home PTA. Pt enjoys chicken, salads, and a variety of vegetables. Pt hand-cuts foods prior to eating to make the foods easier to consume since pt does not have teeth and has hx of extensive neck surgery related to cancer . Pt also reports consuming 2-3 regular chocolate Ensure nutrition supplements PTA. RD and dietetic intern brought a chocolate Ensure Enlive to the pt during visit. Pt states  she would prefer consuming the chocolate Ensure Enlive with chocolate ice cream. Son reports that he, his sister, and a neighbor frequently visit the pt's home and bring her food.  RD asked pt if altering her diet consistency to a Dysphagia 2 diet would help with meal consumption. Pt was agreeable to diet modification.  Per H&P on pt's chart, pt's PCP reported a 30 # weight loss in the past 4 months. Pt states that she has not lost that much weight. Pt reports a gradual weight loss of approximately 20 # over several months. Son confirms.Other than H&P, there are no weight records for this pt.  Meds reviewed.  Labs reviewed: elevated BUN (44) NFPE: The nutrition-focused physical exam revealed severe muscle depletion, severe fat depletion, and no edema.  Diet Order:  DIET SOFT Room service appropriate? Yes; Fluid consistency: Thin  Skin:  Wound (see comment) (stage II sacrum)  Last BM:  09/20/15  Height:   Ht Readings from Last 1 Encounters:  09/21/15 5' (1.524 m)    Weight:   Wt Readings from Last 1 Encounters:  09/21/15 93 lb 4.1 oz (42.3 kg)    Ideal Body Weight:  45.5 kg  BMI:  Body mass index is 18.21 kg/m.  Estimated Nutritional Needs:   Kcal:  1300-1500  Protein:  55-70 grams  Fluid:  >/= 1.3 L  EDUCATION NEEDS:   No education needs identified at this time  Danielle Mcintyre Dietetic Intern Pager Number: [phone]

## 2015-09-21 NOTE — Progress Notes (Signed)
Discussed DNR status with patient.  Daughter and patient would like to wait until morning to further discuss CODE status and would like to defer DNR status at this time.  Dr. Luisa Hart notified and order discontinued.

## 2015-09-21 NOTE — Progress Notes (Signed)
PHARMACY - PHYSICIAN COMMUNICATION CRITICAL VALUE ALERT - BLOOD CULTURE IDENTIFICATION (BCID)  Results for orders placed or performed during the hospital encounter of 09/20/15  Blood Culture ID Panel (Reflexed) (Collected: 09/20/2015  4:35 PM)  Result Value Ref Range   Enterococcus species NOT DETECTED NOT DETECTED   Listeria monocytogenes NOT DETECTED NOT DETECTED   Staphylococcus species NOT DETECTED NOT DETECTED   Staphylococcus aureus NOT DETECTED NOT DETECTED   Streptococcus species DETECTED (A) NOT DETECTED   Streptococcus agalactiae NOT DETECTED NOT DETECTED   Streptococcus pneumoniae NOT DETECTED NOT DETECTED   Streptococcus pyogenes NOT DETECTED NOT DETECTED   Acinetobacter baumannii NOT DETECTED NOT DETECTED   Enterobacteriaceae species DETECTED (A) NOT DETECTED   Enterobacter cloacae complex NOT DETECTED NOT DETECTED   Escherichia coli DETECTED (A) NOT DETECTED   Klebsiella oxytoca NOT DETECTED NOT DETECTED   Klebsiella pneumoniae NOT DETECTED NOT DETECTED   Proteus species NOT DETECTED NOT DETECTED   Serratia marcescens NOT DETECTED NOT DETECTED   Carbapenem resistance NOT DETECTED NOT DETECTED   Haemophilus influenzae NOT DETECTED NOT DETECTED   Neisseria meningitidis NOT DETECTED NOT DETECTED   Pseudomonas aeruginosa NOT DETECTED NOT DETECTED   Candida albicans NOT DETECTED NOT DETECTED   Candida glabrata NOT DETECTED NOT DETECTED   Candida krusei NOT DETECTED NOT DETECTED   Candida parapsilosis NOT DETECTED NOT DETECTED   Candida tropicalis NOT DETECTED NOT DETECTED    Name of physician (or Provider) Contacted: Kalman Shan  Changes to prescribed antibiotics required: 1/2 Blood cx showing strep species and E. Coli. Was given azithromycin and ceftriaxone in the ED but no abx continued. After discussion with the provider we will start ceftriaxone.  Elenor Quinones, PharmD, BCPS Clinical Pharmacist Pager [phone] 09/21/2015 10:09 AM

## 2015-09-21 NOTE — Progress Notes (Signed)
MD paged about results of CT scan.

## 2015-09-21 NOTE — Progress Notes (Signed)
This afternoon, I returned to the room to discuss the results of her CT scan. Her son Zenia Resides was at bedside, and she gave me her permission to discuss any clinical updates with him.  They were aware that she did have bacteria in her blood, and I confirmed that it was likely from a GU source given that it was Escherichia coli. I also mentioned that we would treat it with IV fluids and antibiotics and would expect that it should improve.  I reviewed with him the results of the CTA today and the concern for cancer. I told him that until it could be formally ruled out, this would be my main concern. Her son was interested in what additional workup would be necessary to better define her malignancy. I recommended to both of them that we should at least do a CT angiogram of the neck to see if there is any presence of malignancy there. Barring that, we would need recommendations from oncology.  He also asked of me what things that they should be thinking about at this point. I explained to them that any treatment or management should be framed around what one is expecting to achieve, and he reassured me that his mother had a very clear idea of what is important to her, namely quality of life. Earlier today, she define this as being able to do crossword puzzles, live independently, and be herself.  Along these lines, I also reviewed with them CODE STATUS. Her husband had formal paperwork done, but she has not. They agreed with my recommendation of deferring interventions like CPR and mechanical intubation given her frailty and functional status as they do not offer reasonable chance that returning to her quality of life were we to progress in her acute illness to that point.  As such, I will recommended BiPAP only if needed and continue to treat the infection. They both thanked me for my time.

## 2015-09-22 ENCOUNTER — Inpatient Hospital Stay (HOSPITAL_COMMUNITY): Payer: Medicare Other

## 2015-09-22 LAB — BASIC METABOLIC PANEL
ANION GAP: 7 (ref 5–15)
BUN: 31 mg/dL — ABNORMAL HIGH (ref 6–20)
CALCIUM: 8.5 mg/dL — AB (ref 8.9–10.3)
CO2: 27 mmol/L (ref 22–32)
Chloride: 105 mmol/L (ref 101–111)
Creatinine, Ser: 0.66 mg/dL (ref 0.44–1.00)
GFR calc non Af Amer: >60 mL/min (ref >60)
GLUCOSE: 117 mg/dL — AB (ref 65–99)
POTASSIUM: 4 mmol/L (ref 3.5–5.1)
Sodium: 139 mmol/L (ref 135–145)

## 2015-09-22 LAB — LEGIONELLA PNEUMOPHILA SEROGP 1 UR AG: L. PNEUMOPHILA SEROGP 1 UR AG: NEGATIVE

## 2015-09-22 LAB — URINE CULTURE

## 2015-09-22 LAB — PROCALCITONIN: Procalcitonin: 0.88 ng/mL

## 2015-09-22 LAB — TSH: TSH: 0.319 u[IU]/mL — ABNORMAL LOW (ref 0.350–4.500)

## 2015-09-22 MED ORDER — METOPROLOL SUCCINATE ER 100 MG PO TB24
100.0000 mg | ORAL_TABLET | Freq: Every day | ORAL | Status: DC
  Administered 2015-09-22 – 2015-09-25 (×4): 100 mg via ORAL
  Filled 2015-09-22 (×4): qty 1

## 2015-09-22 NOTE — Therapy (Signed)
[place]    , [place],   Phone:     Fax:     Patient Details  Name: Danielle Mcintyre MRN: [id number] Date of Birth: [birth year] Referring Provider:  No ref. provider found  Encounter Date: 09/20/2015  Per nursing patient currently in A-fib with HR running into the 140's. The patient has also been down for testing and will have to have more testing today. Nursing requested therapy return on 09/23/15.   Carney Living PT DPT 09/22/2015, 2:41 PM  [facility]    , [place],   Phone:     Fax:

## 2015-09-22 NOTE — Progress Notes (Signed)
Subjective: Danielle Mcintyre is feeling fine today. She denies chest pain, abdominal pain, palpitations or shortness of breath. Her children were seen in the room this morning and they were all updated on the results and the plan.   Objective:  Vital signs in last 24 hours: Vitals:   09/22/15 0400 09/22/15 0447 09/22/15 0500 09/22/15 0600  BP: 124/69   (!) 143/91  Pulse: 85  91 (!) 104  Resp: (!) 21  15 (!) 22  Temp:  97.6 F (36.4 C)    TempSrc:  Oral    SpO2: 95%  92% 90%  Weight:      Height:       Physical Exam  Constitutional: No distress.  Chronically ill appearing  HENT:  Right jaw is missing   Cardiovascular: Normal rate and regular rhythm.   No murmur heard. Pulmonary/Chest: No respiratory distress. She has no wheezes. She has no rales.  Decreased air entry bilateral   Abdominal: Soft. She exhibits no distension and no mass. There is no tenderness. There is no guarding.  Extremities: no peripheral edema, no calf tenderness   Labs: CBC:  Recent Labs Lab 09/20/15 1628 09/21/15 0157 09/21/15 0534  WBC 13.6* 9.2 10.4  NEUTROABS 12.2*  --   --   HGB 15.2* 13.0 13.6  HCT 46.3* 40.1 41.6  MCV 93.5 92.6 92.9  PLT 171 790* 240*   Metabolic Panel:  Recent Labs Lab 09/20/15 1628 09/21/15 0157 09/21/15 0534 09/22/15 0324  NA 139 139 139 139  K 3.6 3.6 3.5 4.0  CL 99* 102 104 105  CO2 '31 29 28 27  '$ GLUCOSE 112* 86 86 117*  BUN 58* 45* 44* 31*  CREATININE 1.30* 0.91 0.84 0.66  CALCIUM 9.8 8.7* 9.0 8.5*  ALT 13*  --   --   --   ALKPHOS 74  --   --   --   BILITOT 0.7  --   --   --   PROT 7.2  --   --   --   ALBUMIN 2.8*  --   --   --    Cardiac Labs:  Recent Labs Lab 09/20/15 1628 09/20/15 1641  TROPIPOC  --  0.03  BNP 441.0*  --    Microbiology: Blood culture #1 drawn 9/7 no growth <24 hours  Blood culture #2 drawn 9/7  E coli and Streptococcus  Urine culture collected 9/7 in process  Imaging: Chest xray 9/7 Pulmonary hyperinflation,  suspicious for COPD and Aortic atherosclerosis.  CT angio chest 9/7 No demonstrable pulmonary embolus. Multiple irregular nodular opacities throughout the lungs with areas of surrounding pneumonitis. Suspect multifocal neoplasm given this appearance. It should be noted that septic emboli could present in this manner and is a differential consideration. There are areas of patchy pneumonitis in the areas of these nodular opacities, most notably in the superior segment right upper lobe present at multiple sites. There is a small left pleural effusion with bibasilar atelectasis. There is a degree of reticulonodular interstitial disease throughout the left lower lobe region. This finding potentially could indicate early lymphangitic spread of tumor. Given these findings, it may be prudent to correlate with nuclear medicine PET study to further assess. Alternatively, bronchoscopy with bronchial washings potentially could be helpful for further evaluation with respect to neoplasm versus infection. There is underlying emphysematous change. There is bronchiectasis, primarily in the left lower lobe region. There is no appreciable adenopathy. There is adrenal hypertrophy bilaterally but no well-defined  adrenal mass seen. There is a small pericardial effusion. There are multiple foci of coronary artery calcification as well as calcification in the aorta  Medications:   Scheduled Medications: . cefTRIAXone (ROCEPHIN)  IV  2 g Intravenous Q24H  . enoxaparin (LOVENOX) injection  30 mg Subcutaneous Q24H  . feeding supplement (ENSURE ENLIVE)  237 mL Oral TID BM  . levothyroxine  75 mcg Oral QAC breakfast  . mouth rinse  15 mL Mouth Rinse BID  . pneumococcal 23 valent vaccine  0.5 mL Intramuscular Tomorrow-1000   PRN Medications: albuterol  Assessment/Plan: Pt is a 81 y.o. yo female with a PMHx of throat cancer status post radiation therapy and radical neck dissection, hypothyroidism,  hypertension, diet-controlled diabetes who was admitted on 09/20/2015 with symptoms of acute respiratory distress and was found to have bacteremia and lung nodules.  Principal Problem:   Bacteremia Active Problems:   Protein-calorie malnutrition, severe   Pressure ulcer  Bacteremia: Blood cultures 1 of 2 showed Escherichia coli and Streptococcus viridans. Appears consistent with hypoxia and leukocytosis noted on admission. Urinalysis on admission did have bacteriuria though she did not have any urinary complaints which would be consistent with urinary tract infection and urine cultures showed insignificant growth. Unclear source for the Streptococcus. -Continue supplemental oxygen and IV fluids @ 75 cc/hr  -Continue ceftriaxone IV [VKP 3]  Lung nodules: CTA revealed pulmonary nodularity suspicious for malignancy, reported 30 pounds weight loss over the last 4 months and prior smoking history. She has a history of throat cancer, the plan is for CT neck tomorrow to evaluate for local metastasis. With her age, ovarian malignancy is another possibility for the primary site.  - follow up transvaginal ultrasound -plan for CT neck tomorrow   Paroxysmal afib Noticed on telemetry. She has no prior history and remains  asymptomatic.  - follow up EKG  -follow up TSH   Prerenal acute kidney injury: Resolved. Creatinine 0.84, improved from 1.3 on admission.  Hypothyroidism: Continue home levothyroxine 75 g daily. -follow up TSH  Dispo: Anticipated discharge in approximately 2-4 day(s).   LOS: 2 days   Ledell Noss, MD 09/22/2015, 7:27 AM Pager: [phone]

## 2015-09-23 ENCOUNTER — Encounter (HOSPITAL_COMMUNITY): Payer: Self-pay | Admitting: Radiology

## 2015-09-23 ENCOUNTER — Inpatient Hospital Stay (HOSPITAL_COMMUNITY): Payer: Medicare Other

## 2015-09-23 LAB — CULTURE, BLOOD (ROUTINE X 2)

## 2015-09-23 MED ORDER — IOPAMIDOL (ISOVUE-300) INJECTION 61%
INTRAVENOUS | Status: AC
  Filled 2015-09-23: qty 30

## 2015-09-23 MED ORDER — IOPAMIDOL (ISOVUE-300) INJECTION 61%
INTRAVENOUS | Status: AC
  Administered 2015-09-23: 100 mL
  Filled 2015-09-23: qty 100

## 2015-09-23 MED ORDER — RIVAROXABAN (XARELTO) EDUCATION KIT FOR AFIB PATIENTS
PACK | Freq: Once | Status: DC
  Filled 2015-09-23: qty 1

## 2015-09-23 MED ORDER — RIVAROXABAN 15 MG PO TABS
15.0000 mg | ORAL_TABLET | Freq: Every day | ORAL | Status: DC
  Administered 2015-09-24: 15 mg via ORAL
  Filled 2015-09-23: qty 1

## 2015-09-23 MED ORDER — RIVAROXABAN 20 MG PO TABS: 20.0000 mg | ORAL_TABLET | Freq: Every day | ORAL | Status: DC

## 2015-09-23 NOTE — Progress Notes (Signed)
Pt and son (at bedside) informed of new room/unit number, [phone]. (MD had informed pt and daughter of plans to transfer to telemetry monitoring unit this am).

## 2015-09-23 NOTE — Evaluation (Signed)
Physical Therapy Evaluation Patient Details Name: Danielle Mcintyre MRN: [id number] DOB: August 08, 1934 Today's Date: 09/23/2015   History of Present Illness  81 yo F with PMHx of oral cancer who p/w SOB, cough, confusion. Per report from pt and family, pt has had increasingly productive cough over past week. She has had intermittent SOB as well. She has also had chills but no fevers, decreasing appetite, and some increased confusion according to the family. No LE edema, orthopnea, or PND. No chest pain. No known sick contacts. She went to her PCP today for her cough when she was noted to be hypotensive to 80/40 with O2 sats in mid 70s. Brought here immediately for evaluation.  Clinical Impression  Pt admitted with above diagnosis. Pt currently with functional limitations due to the deficits listed below (see PT Problem List). Pt son states that pt will go home with daughter.  Suggest rollator HH therapies as below and 24 hour care and son agrees.  Will follow acutely.  Pt will benefit from skilled PT to increase their independence and safety with mobility to allow discharge to the venue listed below.      Follow Up Recommendations Home health PT;Supervision/Assistance - 24 hour (HHOT, HHAide)    Equipment Recommendations  Other (comment) (rollator)    Recommendations for Other Services       Precautions / Restrictions Precautions Precautions: Fall Restrictions Weight Bearing Restrictions: No      Mobility  Bed Mobility Overal bed mobility: Needs Assistance Bed Mobility: Supine to Sit     Supine to sit: Min assist     General bed mobility comments: Pt needed a little assist for elevation of trunk with pt using bed rail.  Transfers Overall transfer level: Needs assistance Equipment used: 2 person hand held assist Transfers: Sit to/from Stand Sit to Stand: Mod assist;+2 safety/equipment;Min assist         General transfer comment: Pt maintains narrow BOS making gait unsteady at  times.  Pt was holding Bil HHA and single HHA at times but constant need for min to mod assist due to unsteady on feet.    Ambulation/Gait Ambulation/Gait assistance: Mod assist;Min assist;+2 physical assistance Ambulation Distance (Feet): 100 Feet Assistive device: 2 person hand held assist Gait Pattern/deviations: Step-through pattern;Decreased stride length;Staggering left;Staggering right;Drifts right/left;Trunk flexed;Narrow base of support   Gait velocity interpretation: Below normal speed for age/gender General Gait Details: Pt needed cues to safety with gait.  Needed +2 for steadying as pt very unsteady at times.  Pt appeared distracted and did not folllow cues for sequencing.   Stairs            Wheelchair Mobility    Modified Rankin (Stroke Patients Only)       Balance Overall balance assessment: Needs assistance;History of Falls Sitting-balance support: No upper extremity supported;Feet supported Sitting balance-Leahy Scale: Fair     Standing balance support: Bilateral upper extremity supported;During functional activity Standing balance-Leahy Scale: Poor Standing balance comment: relies on UE for support.             High level balance activites: Direction changes;Turns;Sudden stops High Level Balance Comments: mod assist with challenges.              Pertinent Vitals/Pain Pain Assessment: No/denies pain    SATURATION QUALIFICATIONS: (This note is used to comply with regulatory documentation for home oxygen)  Patient Saturations on Room Air at Rest = 88-90%  Patient Saturations on Room Air while Ambulating = 85-87%  Patient Saturations  on 3 Liters of oxygen while Ambulating = 90-94%  Please briefly explain why patient needs home oxygen:Pt needed O2 to not desat.  May need home O2.   Home Living Family/patient expects to be discharged to:: Private residence Living Arrangements: Alone Available Help at Discharge: Family;Available 24 hours/day  (daughter can stay with pt per son) Type of Home: House Home Access: Stairs to enter Entrance Stairs-Rails: None Entrance Stairs-Number of Steps: 2 Home Layout: One level Home Equipment: Walker - standard;Cane - single point;Shower seat      Prior Function Level of Independence: Independent with assistive device(s)         Comments: used cane at times per son; daughter always with pt when she bathed.     Hand Dominance        Extremity/Trunk Assessment   Upper Extremity Assessment: Defer to OT evaluation           Lower Extremity Assessment: Generalized weakness      Cervical / Trunk Assessment: Kyphotic  Communication   Communication: Other (comment) (difficult to understand speech at times.)  Cognition Arousal/Alertness: Awake/alert Behavior During Therapy: WFL for tasks assessed/performed Overall Cognitive Status: Within Functional Limits for tasks assessed                      General Comments      Exercises        Assessment/Plan    PT Assessment Patient needs continued PT services  PT Diagnosis Generalized weakness   PT Problem List Decreased activity tolerance;Decreased balance;Decreased mobility;Decreased knowledge of use of DME;Decreased safety awareness;Decreased knowledge of precautions  PT Treatment Interventions DME instruction;Gait training;Functional mobility training;Therapeutic activities;Therapeutic exercise;Stair training;Balance training;Patient/family education   PT Goals (Current goals can be found in the Care Plan section) Acute Rehab PT Goals Patient Stated Goal: to get better PT Goal Formulation: With patient/family Time For Goal Achievement: 10/07/15 Potential to Achieve Goals: Good    Frequency Min 3X/week   Barriers to discharge        Co-evaluation               End of Session Equipment Utilized During Treatment: Gait belt;Oxygen Activity Tolerance: Patient limited by fatigue Patient left: in  chair;with call bell/phone within reach;with chair alarm set;with family/visitor present Nurse Communication: Mobility status         Time: 4462-8638 PT Time Calculation (min) (ACUTE ONLY): 26 min   Charges:   PT Evaluation $PT Eval Moderate Complexity: 1 Procedure PT Treatments $Gait Training: 8-22 mins   PT G CodesDenice Paradise 09-28-2015, 4:06 PM Nashla Althoff,PT Acute Rehabilitation [phone] (pager)

## 2015-09-23 NOTE — Progress Notes (Signed)
Subjective: Danielle Mcintyre has no complaints. Daughter is requesting a decongestant for patient even though patient reports her congestion has improved. Pt does not want to have any invasive procedures done stating that she is too old and would not tolerate it.   Objective:  Vital signs in last 24 hours: Vitals:   09/22/15 2339 09/23/15 0341 09/23/15 0425 09/23/15 0824  BP: (!) 117/59 117/67  (!) 104/59  Pulse: 70 71  70  Resp: 20 20  (!) 22  Temp: 97.7 F (36.5 C) 98.4 F (36.9 C)  97.6 F (36.4 C)  TempSrc: Oral Oral  Oral  SpO2: 97% 92%  92%  Weight:   92 lb 6 oz (41.9 kg)   Height:       Physical Exam  Constitutional: She appears well-developed and well-nourished. No distress.  Thin, frail   HENT:  Right jaw is missing   Cardiovascular: Normal rate and regular rhythm.  Exam reveals no friction rub.   No murmur heard. Pulmonary/Chest: Effort normal and breath sounds normal. No respiratory distress. She has no wheezes. She has no rales.  Abdominal: Soft. She exhibits no distension and no mass. There is no tenderness. There is no guarding.  Skin: She is not diaphoretic.    Labs: CBC:  Recent Labs Lab 09/20/15 1628 09/21/15 0157 09/21/15 0534  WBC 13.6* 9.2 10.4  NEUTROABS 12.2*  --   --   HGB 15.2* 13.0 13.6  HCT 46.3* 40.1 41.6  MCV 93.5 92.6 92.9  PLT 171 017* 793*   Metabolic Panel:  Recent Labs Lab 09/20/15 1628 09/21/15 0157 09/21/15 0534 09/22/15 0324  NA 139 139 139 139  K 3.6 3.6 3.5 4.0  CL 99* 102 104 105  CO2 '31 29 28 27  '$ GLUCOSE 112* 86 86 117*  BUN 58* 45* 44* 31*  CREATININE 1.30* 0.91 0.84 0.66  CALCIUM 9.8 8.7* 9.0 8.5*  ALT 13*  --   --   --   ALKPHOS 74  --   --   --   BILITOT 0.7  --   --   --   PROT 7.2  --   --   --   ALBUMIN 2.8*  --   --   --    Cardiac Labs:  Recent Labs Lab 09/20/15 1628 09/20/15 1641  TROPIPOC  --  0.03  BNP 441.0*  --    Microbiology: Blood culture 9/7 > e.coli and strept viridians- pan  sensitive Urine culture collected 9/7 > insignificant grown Blood culture 9/10>   Imaging: Chest xray 9/7 Pulmonary hyperinflation, suspicious for COPD and Aortic atherosclerosis.  CT angio chest 9/7 No demonstrable pulmonary embolus. Multiple irregular nodular opacities throughout the lungs with areas of surrounding pneumonitis. Suspect multifocal neoplasm given this appearance. It should be noted that septic emboli could present in this manner and is a differential consideration. There are areas of patchy pneumonitis in the areas of these nodular opacities, most notably in the superior segment right upper lobe present at multiple sites. There is a small left pleural effusion with bibasilar atelectasis. There is a degree of reticulonodular interstitial disease throughout the left lower lobe region. This finding potentially could indicate early lymphangitic spread of tumor. Given these findings, it may be prudent to correlate with nuclear medicine PET study to further assess. Alternatively, bronchoscopy with bronchial washings potentially could be helpful for further evaluation with respect to neoplasm versus infection. There is underlying emphysematous change. There is bronchiectasis, primarily in  the left lower lobe region. There is no appreciable adenopathy. There is adrenal hypertrophy bilaterally but no well-defined adrenal mass seen. There is a small pericardial effusion. There are multiple foci of coronary artery calcification as well as calcification in the aorta  Medications:   Scheduled Medications: . cefTRIAXone (ROCEPHIN)  IV  2 g Intravenous Q24H  . enoxaparin (LOVENOX) injection  30 mg Subcutaneous Q24H  . feeding supplement (ENSURE ENLIVE)  237 mL Oral TID BM  . levothyroxine  75 mcg Oral QAC breakfast  . mouth rinse  15 mL Mouth Rinse BID  . metoprolol succinate  100 mg Oral Daily  . pneumococcal 23 valent vaccine  0.5 mL Intramuscular Tomorrow-1000   PRN  Medications: albuterol  Assessment/Plan: Pt is a 81 y.o. yo female with a PMHx of throat cancer status post radiation therapy and radical neck dissection, hypothyroidism, hypertension, diet-controlled diabetes who was admitted on 09/20/2015 with symptoms of acute respiratory distress and was found to have bacteremia and lung nodules.  Principal Problem:   Bacteremia Active Problems:   Protein-calorie malnutrition, severe   Pressure ulcer  Bacteremia: Blood cultures 9/7 grew Escherichia coli and Streptococcus viridans. Pro calcitonin 3.38>0.88 - getting 3rd dose of rocephin today at noon, then can transition to oral cefpodoxime '100mg'$  BID x 4 days for a total of 7 days of abx.  -repeat blood cultures today  Lung nodules: CTA revealed pulmonary nodularity suspicious for malignancy, reported 30 pounds weight loss over the last 4 months and prior smoking history. She has a history of throat cancer, the plan is for CT neck today to evaluate for local metastasis. Transvaginal ultrasound negative. Patient does not want any invasive procedures or surgeries but is willing to proceed with CT neck today.  - CT neck and abd/pelvis w contrast today.  - pt's daughter Is requesting pulmonology consult. Dr. Corrie Dandy will kindly see patient this evening - can follow up with [facility] at outpatient  Paroxysmal afib Noticed on telemetry. EKG neg for afib and no afib noted on tele overnight. TSH minimally low, on synthroid. CTM  HTN- resumed home metoprolol XL '100mg'$  yesterday  Hypothyroidism: TSH 0.391 - Continue home levothyroxine 75 g daily.  Dispo: Anticipated discharge in approximately 2-4 day(s).   LOS: 3 days   Norman Herrlich, MD 09/23/2015, 9:23 AM Pager: [phone]

## 2015-09-23 NOTE — Progress Notes (Signed)
Report given to RN Lonnie on unit 5 [place], will transfer pt.

## 2015-09-23 NOTE — Progress Notes (Signed)
Transfer delayed due to pt eating lunch. Pt transferred via bed with O2 and monitor to unit [place], room [phone] with her belongings, son present at time of transfer.

## 2015-09-23 NOTE — Progress Notes (Signed)
SATURATION QUALIFICATIONS: (This note is used to comply with regulatory documentation for home oxygen)  Patient Saturations on Room Air at Rest = 88-90%  Patient Saturations on Room Air while Ambulating = 85-87%  Patient Saturations on 3 Liters of oxygen while Ambulating = 90-94%  Please briefly explain why patient needs home oxygen:Pt needed O2 to not desat.  May need home O2.  Thanks.  [place] [phone] (pager)

## 2015-09-23 NOTE — Progress Notes (Signed)
Attempted to call report to unit 5 [place], pt to transfer to tele bed room [phone]. Nurse not available, will try back shortly.

## 2015-09-24 DIAGNOSIS — R7989 Other specified abnormal findings of blood chemistry: Secondary | ICD-10-CM | POA: Diagnosis present

## 2015-09-24 DIAGNOSIS — I480 Paroxysmal atrial fibrillation: Secondary | ICD-10-CM

## 2015-09-24 DIAGNOSIS — K838 Other specified diseases of biliary tract: Secondary | ICD-10-CM

## 2015-09-24 DIAGNOSIS — K7689 Other specified diseases of liver: Secondary | ICD-10-CM | POA: Diagnosis present

## 2015-09-24 DIAGNOSIS — R918 Other nonspecific abnormal finding of lung field: Secondary | ICD-10-CM | POA: Diagnosis present

## 2015-09-24 DIAGNOSIS — R7881 Bacteremia: Secondary | ICD-10-CM

## 2015-09-24 LAB — PROTIME-INR
INR: 1.1
Prothrombin Time: 14.3 seconds (ref 11.4–15.2)

## 2015-09-24 LAB — PROCALCITONIN: Procalcitonin: 0.21 ng/mL

## 2015-09-24 MED ORDER — CEPHALEXIN 250 MG PO CAPS
250.0000 mg | ORAL_CAPSULE | Freq: Four times a day (QID) | ORAL | Status: DC
  Administered 2015-09-24 – 2015-09-25 (×5): 250 mg via ORAL
  Filled 2015-09-24 (×7): qty 1

## 2015-09-24 NOTE — Discharge Instructions (Addendum)
Thank you for trusting [place] with your medical care!  You were hospitalized for bacteremia and paroxysmal atrial fibrillation and treated with close monitoring and antibiotics.   Please take note of the following changes to your medications: Start taking Keflex 250 mg  Start taking xarelto '15mg'$  daily Decrease to Synthroid 50 mg daily   To make sure you are getting better, please make it to the follow-up appointments listed on the first page.  If you have any questions, please call [phone].   Information on my medicine - XARELTO (Rivaroxaban)  This medication education was reviewed with me or my healthcare representative as part of my discharge preparation.    Why was Xarelto prescribed for you? Xarelto was prescribed for you to reduce the risk of a blood clot forming that can cause a stroke if you have a medical condition called atrial fibrillation (a type of irregular heartbeat).  What do you need to know about xarelto ? Take your Xarelto ONCE DAILY at the same time every day with your evening meal. If you have difficulty swallowing the tablet whole, you may crush it and mix in applesauce just prior to taking your dose.  Take Xarelto exactly as prescribed by your doctor and DO NOT stop taking Xarelto without talking to the doctor who prescribed the medication.  Stopping without other stroke prevention medication to take the place of Xarelto may increase your risk of developing a clot that causes a stroke.  Refill your prescription before you run out.  After discharge, you should have regular check-up appointments with your healthcare provider that is prescribing your Xarelto.  In the future your dose may need to be changed if your kidney function or weight changes by a significant amount.  What do you do if you miss a dose? If you are taking Xarelto ONCE DAILY and you miss a dose, take it as soon as you remember on the same day then continue your regularly scheduled once  daily regimen the next day. Do not take two doses of Xarelto at the same time or on the same day.   Important Safety Information A possible side effect of Xarelto is bleeding. You should call your healthcare provider right away if you experience any of the following: ? Bleeding from an injury or your nose that does not stop. ? Unusual colored urine (red or dark brown) or unusual colored stools (red or black). ? Unusual bruising for unknown reasons. ? A serious fall or if you hit your head (even if there is no bleeding).  Some medicines may interact with Xarelto and might increase your risk of bleeding while on Xarelto. To help avoid this, consult your healthcare provider or pharmacist prior to using any new prescription or non-prescription medications, including herbals, vitamins, non-steroidal anti-inflammatory drugs (NSAIDs) and supplements.  This website has more information on Xarelto: [email or web address]

## 2015-09-24 NOTE — Progress Notes (Signed)
Subjective: [name] is feeling well and free of complaints, she denies shortness of breath, chest pain, or abdominal pain. Her daughter is in the room and her son is called over speaker phone, they are updated on the plan.    Objective:  Vital signs in last 24 hours: Vitals:   09/23/15 1420 09/23/15 1423 09/23/15 2225 09/24/15 0638  BP:  (!) 146/71 (!) 107/48 (!) 112/56  Pulse:  68 (!) 58 72  Resp:  '17 17 18  '$ Temp:  98 F (36.7 C) 98 F (36.7 C) 98.4 F (36.9 C)  TempSrc:  Oral Oral Oral  SpO2:  93% 94% 91%  Weight:    103 lb 14.4 oz (47.1 kg)  Height: '5\' 1"'$  (1.549 m)      Physical Exam  Constitutional: She appears well-developed.  Cardiovascular: Normal rate and regular rhythm.   No murmur heard. Pulmonary/Chest: Effort normal. She has no wheezes. She has no rales.  Left middle and lower lung field decreased air entry   Abdominal: Soft. She exhibits no distension. There is no tenderness.  Neurological: She is alert.  Extremities: no calf tenderness, no peripheral edema   Labs: CBC:  Recent Labs Lab 09/20/15 1628 09/21/15 0157 09/21/15 0534  WBC 13.6* 9.2 10.4  NEUTROABS 12.2*  --   --   HGB 15.2* 13.0 13.6  HCT 46.3* 40.1 41.6  MCV 93.5 92.6 92.9  PLT 171 580* 998*   Metabolic Panel:  Recent Labs Lab 09/20/15 1628 09/21/15 0157 09/21/15 0534 09/22/15 0324  NA 139 139 139 139  K 3.6 3.6 3.5 4.0  CL 99* 102 104 105  CO2 '31 29 28 27  '$ GLUCOSE 112* 86 86 117*  BUN 58* 45* 44* 31*  CREATININE 1.30* 0.91 0.84 0.66  CALCIUM 9.8 8.7* 9.0 8.5*  ALT 13*  --   --   --   ALKPHOS 74  --   --   --   BILITOT 0.7  --   --   --   PROT 7.2  --   --   --   ALBUMIN 2.8*  --   --   --    Cardiac Labs:  Recent Labs Lab 09/20/15 1628 09/20/15 1641  TROPIPOC  --  0.03  BNP 441.0*  --    CEA - in process  Microbiology: Blood cultures collected 9/10 - no growth to date  Blood culture 9/7 e.coli and strept viridians- pan sensitive in 1 of 2 bottles    Urine culture collected 9/7 insignificant growth  Imaging: CT abdomen and pelvis 9/10  1. Mild patchy consolidation and tree-in-bud opacity in the dependent left lower lobe with associated mild atelectasis and small layering left pleural effusion, suggesting left lower lobe pneumonia and/or aspiration. 2. Cirrhosis. Diffusely heterogeneous liver parenchymal enhancement with apparent small hyperenhancing nodular foci, which are indeterminate for hepatocellular carcinoma. Short term outpatient evaluation with MRI abdomen without and with IV contrast is recommended when the patient is able to adequately breath-hold. 3. Small volume ascites. 4. Staghorn left renal calculi. Asymmetric left renal atrophy. No hydronephrosis. Nonspecific urothelial thickening in the left renal pelvis, probably represent chronic inflammatory change due to the left renal stones, however cannot exclude ascending left urinary tract infection. 5. Cholelithiasis. No intrahepatic biliary ductal dilatation. Dilated common bile duct. Recommend correlation with serum bilirubin levels. 6. Aortic atherosclerosis. Infrarenal abdominal aortic ectasia, maximum diameter 2.8 cm. Ectatic abdominal aorta at risk for aneurysm development. Recommend followup by ultrasound in  5 years  CT neck 9/10  1. Treated oral cancer without evidence of recurrent disease. 2. Oral contrast administered for contemporaneous abdominal CT shows hypopharyngeal stasis and cord level laryngeal penetration. 3. 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 Other left lung opacities may reflect aspiration pneumonia given #2.   Medications:   Scheduled Medications: . cefTRIAXone (ROCEPHIN)  IV  2 g Intravenous Q24H  . feeding supplement (ENSURE ENLIVE)  237 mL Oral TID BM  . levothyroxine  75 mcg Oral QAC breakfast  . mouth rinse  15 mL Mouth Rinse BID  . metoprolol succinate  100 mg Oral Daily  . pneumococcal 23 valent vaccine  0.5 mL  Intramuscular Tomorrow-1000  . rivaroxaban   Does not apply Once  . rivaroxaban  15 mg Oral Q supper   PRN Medications: albuterol  Assessment/Plan: Pt is a 81 y.o. yo female with a PMHx of throat cancer status post radiation therapy and radical neck dissection, hypothyroidism, hypertension, diet-controlled diabetes who was admitted on 09/20/2015 with symptoms of acute respiratory distress and was found to have bacteremia and lung nodules.   Principal Problem:   Bacteremia Active Problems:   Protein-calorie malnutrition, severe   Pressure ulcer  Bacteremia: Blood cultures 1 of 2 showed Escherichia coli and Streptococcus viridans. Urinalysis on admission did have bacteriuria though urine cultures showed insignificant growth. The source is unclear. CT abdomen showed a dilated common bile duct but no obstructing stones, this  -follow up RUQ ultrasound  -follow up ECHO  -Continue supplemental oxygen, SpO2 is in the low 90s on 2-3 liters  -She has received 4 days of IV Ceftriaxone IV, this will be transitioned to keflex 250 mg q 6 hours today  Lung nodules: CTA revealed pulmonary nodularity suspicious for malignancy, reported 30 pounds weight loss over the last 4 months and prior smoking history. She has a history of throat cancer, CT neck was negative for recurrent disease. CT abdomen showed heterogeneous liver parenchyma with nodularity.  - follow up INR - follow up Hep B and C serologies   Paroxysmal afib Noticed on telemetry, EKG negative for Afib. She has no prior history and remains asymptomatic.   - started xarelto 15 mg   Prerenal acute kidney injury: Resolved. Creatinine 0.66 on last CMET 9/9, improved from 1.3 on admission.  Hypothyroidism: Continue home levothyroxine 75 g daily.   Dispo: Anticipated discharge in approximately 1-2 day(s).   LOS: 4 days   Danielle Noss, MD 09/24/2015, 9:01 AM Pager: [phone]

## 2015-09-24 NOTE — Care Management Note (Signed)
Case Management Note  Patient Details  Name: Danielle Mcintyre MRN: [id number] Date of Birth: September 30, 1934  Subjective/Objective:                 Spoke with patient and daughter at the bedside. Patient admitted with bacteremia. HH offered, declined all services. Xaralto 30 day card given. Daughter plans on providing transportation via private car home at discharge.    Action/Plan:  Will need: Home O2 order and referral. Tank for transport home. Anticipate DC Tuesday.  Expected Discharge Date:  09/22/15               Expected Discharge Plan:  Home/Self Care  In-House Referral:  NA  Discharge planning Services  CM Consult  Post Acute Care Choice:  Durable Medical Equipment Choice offered to:  Patient, Adult Children  DME Arranged:  Oxygen DME Agency:  [place] Arranged:  NA HH Agency:     Status of Service:  In process, will continue to follow  If discussed at Long Length of Stay Meetings, dates discussed:    Additional Comments:  Carles Collet, RN 09/24/2015, 1:55 PM

## 2015-09-24 NOTE — Discharge Summary (Signed)
Name: Danielle Mcintyre MRN: [id number] DOB: 18-Dec-1934 81 y.o. PCP: Dione Housekeeper, MD  Date of Admission: 09/20/2015  3:43 PM Date of Discharge: 09/25/2015 Attending Physician: Lucious Groves, DO  Discharge Diagnosis: Principal Problem:   Bacteremia Active Problems:   Protein-calorie malnutrition, severe   Pressure ulcer   Low TSH level   Liver nodule   Lung nodules   Diastolic dysfunction   Cholelithiasis Paroxysmal Afib   Discharge Medications:   Medication List    TAKE these medications   amLODipine 5 MG tablet Commonly known as:  NORVASC Take 5 mg by mouth daily.   cephALEXin 250 MG capsule Commonly known as:  KEFLEX Take 1 capsule (250 mg total) by mouth every 6 (six) hours. Stop date 9/20   feeding supplement (ENSURE ENLIVE) Liqd Take 237 mLs by mouth 3 (three) times daily between meals.   guaiFENesin-codeine 100-10 MG/5ML syrup Commonly known as:  ROBITUSSIN AC Take 10 mLs by mouth 3 (three) times daily as needed for cough.   levothyroxine 50 MCG tablet Commonly known as:  SYNTHROID, LEVOTHROID Take 50 mcg by mouth daily before breakfast.   metoprolol succinate 100 MG 24 hr tablet Commonly known as:  TOPROL-XL Take 100 mg by mouth daily.   Rivaroxaban 15 MG Tabs tablet Commonly known as:  XARELTO Take 1 tablet (15 mg total) by mouth daily with supper.       Disposition and follow-up:   Danielle Mcintyre was discharged from [facility] in Good condition.  At the hospital follow up visit please address:  1. Pulmonary nodules: please repeat CT chest in 2 months Hepatitis B non immunity: please administer Hep B vaccine  2.  Labs / imaging needed at time of follow-up: Repeat CT chest in 3 months (december, 2017), EKG    3.  Pending labs/ test needing follow-up: none   Follow-up Appointments: Follow-up Information    Magdalen Spatz, NP Follow up on 10/08/2015.   Specialty:  Pulmonary Disease Why:  Your appointment is at  10:30AM Contact information: [address] [place] [address] [phone]        Dione Housekeeper, MD. Schedule an appointment as soon as possible for a visit today.   Why:  Call your primary care doctor as soon as possible to schedule a follow up appointment in the next 1-2 weeks           Hospital Course by problem list: Principal Problem:   Bacteremia Active Problems:   Protein-calorie malnutrition, severe   Pressure ulcer   Low TSH level   Liver nodule   Lung nodules   Diastolic dysfunction   Cholelithiasis Paroxysmal Afib    Lung nodules  Danielle Mcintyre is a 81 year old woman with a past medical history of hypertension, hypothyroidism and diet-controlled diabetes who presented from her PCPs office with respiratory distress. She reported 30 pound weight loss over the past 4 months and was found to be tachypnic with SpO2 85% on room air at her PCPs office. In the ED she was started on 3 liter of O2 and sats improved to 95%. She was found to have elevated D-dimer and Spiral CT angio chest showed no pulmonary embolus but irregular pulmonary nodularity throughout. Danielle Mcintyre children were very involved in her care and asked to speak with  a Pulmonologist, during this consultation bronchoscopy was discussed but Danielle Mcintyre consistently expressed that she did not want to have any invasive procedures. CT abdomen  and pelvis showed a diffusely heterogeneous liver suggestive of cirrhosis with a 1.2 cm nodular focus. Of note, Danielle Mcintyre was also found to have bacteremia on this admission so there is some possibility that these pulmonary nodules could be reactive in etiology should be ruled out with a repeat CT chest in 3 months after she has completed a course of  antibiotic.   Liver nodules  CT abdomen showed a 1.2 cm nodular focus. Hepatitis screening negative, and INR showed normal hepatic function.   Bacteremia:  One of the two blood cultures drawn on admission9/7  had  E.colli, streptococcus viridans, and coagulase negative staph. She was started on ceftriaxone for 3 days and was transitioned to Keflex 250 mg q6h on discharge for a 2 week course with stop date 9/20. Repeat blood cultures 9/10 showed no growth for 4 days.   Paroxysmal afib  Captured on telemetry during this hospitalization. Started xarelto 15 mg daily.   Protein calorie malnutrition 30 lb weight loss and decreased appetite in the setting of recently diagnosed pulmonary nodules. She was discharged with a prescription for Ensure.   Low TSH  PMH of hypothyroid treated with synthroid 75 mg daily. TSH 0.32 this admission. TSH goal should be 0.5-1.0 given her advanced age. Would recommend decreasing Synthroid to 50 mg daily.    Discharge Vitals:   BP 128/70 (BP Location: Right Arm)   Pulse 62   Temp 98.4 F (36.9 C) (Oral)   Resp 18   Ht '5\' 1"'$  (1.549 m)   Wt 103 lb 14.4 oz (47.1 kg)   SpO2 97%   BMI 19.63 kg/m   Pertinent Labs, Studies, and Procedures: TSH 0.32  Heptatitis A Ab negative  Hepatitis B S Ab non reactive  Hepatitis B Core Ab negative  HCV Ab <0.1 CEA 13.7 (referange range 0.0-4.7 ng/mL  Procedures Performed:  Ct Soft Tissue Neck W Contrast  Result Date: 09/23/2015 CLINICAL DATA:  Past medical history of oral cancer. Increasing productive cough for a week. EXAM: CT NECK WITH CONTRAST TECHNIQUE: Multidetector CT imaging of the neck was performed using the standard protocol following the bolus administration of intravenous contrast. CONTRAST:  146m ISOVUE-300 IOPAMIDOL (ISOVUE-300) INJECTION 61% COMPARISON:  None. FINDINGS: Pharynx and larynx: Status post partial glossectomy with possible flap. Right hemimandibulectomy and radical right neck dissection changes. No comparison imaging to document stability, but no suspicious nodular enhancement to suggest residual or recurrent disease. Oral contrast administered for abdomen pelvis CT. Contrast is seen layering in the hypopharynx  and supraglottic larynx. Salivary glands: Diffuse post treatment atrophy with right submandibular resection. Thyroid: Atrophic. Lymph nodes: No adenopathy. Vascular: Extensive atherosclerotic calcification with moderate proximal right ICA mid stenosis. Sacrificed right jugular veins. Limited intracranial: No acute finding. Visualized orbits: Negative Mastoids and visualized paranasal sinuses: Clear Skeleton: Right hemimandibulectomy. Diffuse degenerative changes in the cervical spine. Prominent arachnoid granulations in the left middle cranial fossa. Upper chest: Recent full chest CT. There is emphysema and a solid and sub solid ~ 2 cm left upper lobe nodule. Trace layering left pleural effusion. Dependent reticular nodular opacity in the superior segment left lower lobe and posterior segment left upper lobe which could reflect acute pneumonia/aspiration pneumonitis. IMPRESSION: 1. Treated oral cancer without evidence of recurrent disease. 2. Oral contrast administered for contemporaneous abdominal CT shows hypopharyngeal stasis and cord level laryngeal penetration. 3. 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 Other left lung opacities may reflect aspiration pneumonia given #2. Electronically Signed   By: JAngelica Chessman  Watts M.D.   On: 09/23/2015 19:41   Ct Angio Chest Pe W Or Wo Contrast  Result Date: 09/21/2015 CLINICAL DATA:  Hypoxia.  History of oral carcinoma EXAM: CT ANGIOGRAPHY CHEST WITH CONTRAST TECHNIQUE: Multidetector CT imaging of the chest was performed using the standard protocol during bolus administration of intravenous contrast. Multiplanar CT image reconstructions and MIPs were obtained to evaluate the vascular anatomy. CONTRAST:  60 mL Isovue 370 nonionic COMPARISON:  Chest radiograph September 20, 2015 FINDINGS: Cardiovascular: There is no demonstrable pulmonary embolus. There is no thoracic aortic aneurysm. Contrast bolus was timed to optimize pulmonary arterial vascular  visualization. Contrast bolus in the thoracic spine is not sufficient to exclude thoracic aortic dissection. There is moderate calcification throughout the aorta as well as in the proximal great vessels. The there is a small pericardial effusion. There are multiple foci of coronary artery calcification evident. Mediastinum/Nodes: Thyroid appears unremarkable. There is no appreciable thoracic adenopathy. Lungs/Pleura: There is scarring in the right apex. There is underlying emphysematous change. There are irregular nodular type lesions throughout the lungs bilaterally, most numerous in the left lower lobe but also prominent in the posterior segment of the left upper lobe and in the superior segment of the right lower lobe. The largest individual lesion is seen in the superior segment right lower lobe measuring 1.6 x 1.5 x 1.2 cm. The lesion in the posterior segment of the left upper lobe measures 1.4 x 1.4 x 1.0 cm. The largest of these lesions in the left lower lobe is in the lateral segment measuring 1.5 x 1.0 x 1.0 cm. There is patchy surrounding pneumonitis in each of these areas. Pneumonitis is most notable in the superior segment of the right lower lobe posterior to the more masslike area. There is more ill-defined patchy infiltrate in the posterior segment of the left upper lobe medially and in the medial segment of the right lower lobe. There is a small left pleural effusion with left base atelectasis adjacent to the effusion. There is also mild right base atelectasis. There is a degree of lower lobe bronchiectatic change bilaterally, more notable on the left than on the right. Upper Abdomen: In the visualized upper abdomen, there is extensive atherosclerotic calcification in the aorta. There is bilateral adrenal hypertrophy. Musculoskeletal: There is degenerative change in the thoracic spine. There are no appreciable blastic or lytic bone lesions. Review of the MIP images confirms the above findings.  IMPRESSION: No demonstrable pulmonary embolus. Multiple irregular nodular opacities throughout the lungs with areas of surrounding pneumonitis. Suspect multifocal neoplasm given this appearance. It should be noted that septic emboli could present in this manner and is a differential consideration. There are areas of patchy pneumonitis in the areas of these nodular opacities, most notably in the superior segment right upper lobe present at multiple sites. There is a small left pleural effusion with bibasilar atelectasis. There is a degree of reticulonodular interstitial disease throughout the left lower lobe region. This finding potentially could indicate early lymphangitic spread of tumor. Given these findings, it may be prudent to correlate with nuclear medicine PET study to further assess. Alternatively, bronchoscopy with bronchial washings potentially could be helpful for further evaluation with respect to neoplasm versus infection. There is underlying emphysematous change. There is bronchiectasis, primarily in the left lower lobe region. There is no appreciable adenopathy. There is adrenal hypertrophy bilaterally but no well-defined adrenal mass seen. There is a small pericardial effusion. There are multiple foci of coronary artery calcification as well  as calcification in the aorta. These results will be called to the ordering clinician or representative by the Radiologist Assistant, and communication documented in the PACS or zVision Dashboard. Electronically Signed   By: Lowella Grip III M.D.   On: 09/21/2015 13:11   US Transvaginal Non-ob  Result Date: 09/22/2015 CLINICAL DATA:  Patient with recent diagnosis of metastatic disease. EXAM: TRANSABDOMINAL AND TRANSVAGINAL ULTRASOUND OF PELVIS TECHNIQUE: Both transabdominal and transvaginal ultrasound examinations of the pelvis were performed. Transabdominal technique was performed for global imaging of the pelvis including uterus, ovaries, adnexal  regions, and pelvic cul-de-sac. It was necessary to proceed with endovaginal exam following the transabdominal exam to visualize the adnexal structures. COMPARISON:  Chest CT 09/21/2015 FINDINGS: Uterus Measurements: 4.6 x 2.0 x 3.4 cm. There is a focal parenchymal calcification within the uterus measuring 8 mm. Endometrium Thickness: 4 mm.  No focal abnormality visualized. Right ovary Not visualized. Left ovary Not visualized. Other findings Moderate amount of free fluid. IMPRESSION: The right and left ovaries are not visualized and therefore not assessed. If there is concern for adnexal mass, recommend dedicated evaluation with contrast-enhanced abdomen pelvis CT. Moderate amount of free fluid. Electronically Signed   By: Lovey Newcomer M.D.   On: 09/22/2015 16:02   US Pelvis Complete  Result Date: 09/22/2015 CLINICAL DATA:  Patient with recent diagnosis of metastatic disease. EXAM: TRANSABDOMINAL AND TRANSVAGINAL ULTRASOUND OF PELVIS TECHNIQUE: Both transabdominal and transvaginal ultrasound examinations of the pelvis were performed. Transabdominal technique was performed for global imaging of the pelvis including uterus, ovaries, adnexal regions, and pelvic cul-de-sac. It was necessary to proceed with endovaginal exam following the transabdominal exam to visualize the adnexal structures. COMPARISON:  Chest CT 09/21/2015 FINDINGS: Uterus Measurements: 4.6 x 2.0 x 3.4 cm. There is a focal parenchymal calcification within the uterus measuring 8 mm. Endometrium Thickness: 4 mm.  No focal abnormality visualized. Right ovary Not visualized. Left ovary Not visualized. Other findings Moderate amount of free fluid. IMPRESSION: The right and left ovaries are not visualized and therefore not assessed. If there is concern for adnexal mass, recommend dedicated evaluation with contrast-enhanced abdomen pelvis CT. Moderate amount of free fluid. Electronically Signed   By: Lovey Newcomer M.D.   On: 09/22/2015 16:02   Ct Abdomen  Pelvis W Contrast  Result Date: 09/23/2015 CLINICAL DATA:  81 year old female inpatient with a history of oral cancer presents with shortness of breath and cough. History of appendectomy. EXAM: CT ABDOMEN AND PELVIS WITH CONTRAST TECHNIQUE: Multidetector CT imaging of the abdomen and pelvis was performed using the standard protocol following bolus administration of intravenous contrast. CONTRAST:  118m ISOVUE-300 IOPAMIDOL (ISOVUE-300) INJECTION 61% COMPARISON:  None. FINDINGS: Lower chest: Small layering left pleural effusion. Mild atelectasis in the dependent left lower lobe. Superimposed mild patchy consolidation and tree-in-bud opacity in the dependent left lower lobe. Hepatobiliary: Liver surface is diffusely irregular, consistent with cirrhosis. There is diffusely heterogeneous liver parenchymal enhancement on the initial sequence with scattered nodular foci of apparent hyperenhancement measuring up to 1.2 cm on the left liver lobe (series 3/image 18), which are indeterminate. Gallbladder is completely filled with calcified gallstones measuring up to 1.0 cm, with no gallbladder wall thickening or pericholecystic fluid. No intrahepatic biliary ductal dilatation. Dilated common bile duct (10 mm diameter), with no radiopaque choledocholithiasis. Pancreas: Top-normal caliber main pancreatic duct. No pancreatic mass. Spleen: Normal size. No mass. Adrenals/Urinary Tract: Diffuse mild thickening of the adrenal glands bilaterally without discrete adrenal nodule. There are adjacent 24 x 15  mm left renal pelvis and 16 x 8 mm lower left renal collecting system staghorn calculi. No hydronephrosis. Asymmetric left renal atrophy. Several subcentimeter hypodense renal cortical lesions scattered throughout both kidneys are too small to characterize and require no further follow-up. Nonspecific urothelial thickening in the left renal pelvis. Normal bladder. Stomach/Bowel: Grossly normal stomach. Normal caliber small bowel  with no small bowel wall thickening. Appendectomy. Normal large bowel with no diverticulosis, large bowel wall thickening or pericolonic fat stranding. Vascular/Lymphatic: Atherosclerotic abdominal aorta with infrarenal abdominal aortic ectasia, maximum diameter 2.8 cm. Patent portal, splenic, hepatic and renal veins. No pathologically enlarged lymph nodes in the abdomen or pelvis. Reproductive: Grossly normal uterus.  No adnexal mass. Other: Small volume ascites, predominantly in the deep pelvis. No pneumoperitoneum. Musculoskeletal: No aggressive appearing focal osseous lesions. Moderate thoracolumbar spondylosis. IMPRESSION: 1. Mild patchy consolidation and tree-in-bud opacity in the dependent left lower lobe with associated mild atelectasis and small layering left pleural effusion, suggesting left lower lobe pneumonia and/or aspiration. 2. Cirrhosis. Diffusely heterogeneous liver parenchymal enhancement with apparent small hyperenhancing nodular foci, which are indeterminate for hepatocellular carcinoma. Short term outpatient evaluation with MRI abdomen without and with IV contrast is recommended when the patient is able to adequately breath-hold. 3. Small volume ascites. 4. Staghorn left renal calculi. Asymmetric left renal atrophy. No hydronephrosis. Nonspecific urothelial thickening in the left renal pelvis, probably represent chronic inflammatory change due to the left renal stones, however cannot exclude ascending left urinary tract infection. 5. Cholelithiasis. No intrahepatic biliary ductal dilatation. Dilated common bile duct. Recommend correlation with serum bilirubin levels. 6. Aortic atherosclerosis. Infrarenal abdominal aortic ectasia, maximum diameter 2.8 cm. Ectatic abdominal aorta at risk for aneurysm development. Recommend followup by ultrasound in 5 years. This recommendation follows ACR consensus guidelines: White Paper of the ACR Incidental Findings Committee II on Vascular Findings. J Am Coll  Radiol 2013; 10:789-794. Electronically Signed   By: Ilona Sorrel M.D.   On: 09/23/2015 19:45   Dg Chest Port 1 View  Result Date: 09/20/2015 CLINICAL DATA:  Rhonchi on examination. Questionable pneumonia. Mild hypoxia. Hypotension. EXAM: PORTABLE CHEST 1 VIEW COMPARISON:  08/20/2015 FINDINGS: The cardiomediastinal silhouette is unchanged. Thoracic aortic atherosclerosis is again noted. The lungs remain hyperinflated with chronic interstitial coarsening. No confluent airspace opacity, overt pulmonary edema, pleural effusion, or pneumothorax is identified. An old posterior left seventh rib fracture is again noted. IMPRESSION: 1. Unchanged appearance of the chest with findings suggestive of COPD. 2. Aortic atherosclerosis. Electronically Signed   By: Logan Bores M.D.   On: 09/20/2015 21:18   Dg Chest Port 1 View  Result Date: 09/20/2015 CLINICAL DATA:  Sepsis.  Hypertension. EXAM: PORTABLE CHEST 1 VIEW COMPARISON:  None. FINDINGS: The heart size and mediastinal contours are within normal limits. Aortic atherosclerosis. Pulmonary hyperinflation noted. Both lungs are clear. No pneumothorax or pleural effusion visualized. Old fracture deformity of left posterior seventh rib noted. IMPRESSION: Pulmonary hyperinflation, suspicious for COPD. No active lung disease. Aortic atherosclerosis. Electronically Signed   By: Earle Gell M.D.   On: 09/20/2015 17:21   US Abdomen Limited Ruq  Result Date: 09/25/2015 CLINICAL DATA:  Bacteremia. Prior radical neck dissection for throat cancer. EXAM: US ABDOMEN LIMITED - RIGHT UPPER QUADRANT COMPARISON:  CT of the abdomen and pelvis on 09/23/2015 FINDINGS: Gallbladder: Multiple stones are identified throughout the gallbladder. Gallbladder wall is 2.0 mm in thickness. No sonographic Murphy sign. Largest calculus measured is 8 mm. Common bile duct: Diameter: 4.0 mm.  No choledocholithiasis identified.  Liver: Mildly heterogeneous and nodular in appearance. No focal liver lesions  are identified. IMPRESSION: 1. Cholelithiasis. 2. No sonographic Murphy's. 3. Cirrhotic morphology of the liver. Electronically Signed   By: Nolon Nations M.D.   On: 09/25/2015 09:45   2D Echo 9/12  - Left ventricle: The cavity size was normal. There was mild   concentric hypertrophy. Systolic function was normal. The   estimated ejection fraction was in the range of 60% to 65%. Wall   motion was normal; there were no regional wall motion   abnormalities. Doppler parameters are consistent with abnormal   left ventricular relaxation (grade 1 diastolic dysfunction). - Mitral valve: Calcified and thickend mitral annulus and leaflet   tips. Calcified annulus. - Atrial septum: No defect or patent foramen ovale was identified. - Pulmonary arteries: PA peak pressure: 35 mm Hg (S). - Pericardium, extracardiac: Trivial anterior pericardial effusion  Consultations: Pulmonology    Discharge Instructions: Discharge Instructions    Call MD for:  difficulty breathing, headache or visual disturbances    Complete by:  As directed   Call MD for:  persistant dizziness or light-headedness    Complete by:  As directed   Call MD for:  redness, tenderness, or signs of infection (pain, swelling, redness, odor or green/yellow discharge around incision site)    Complete by:  As directed   Diet - low sodium heart healthy    Complete by:  As directed   Increase activity slowly    Complete by:  As directed      Signed: Ledell Noss, MD 09/25/2015, 2:34 PM   Pager: [phone]

## 2015-09-24 NOTE — Progress Notes (Signed)
Darlene from ultrasounds stated patient needs to be NPO for 6 hours prior to abdominal ultrasound and asked for patient to be NPO tonight after midnight for test.

## 2015-09-24 NOTE — Progress Notes (Signed)
Physical Therapy Treatment Patient Details Name: JAIDAN STACHNIK MRN: [id number] DOB: Apr 05, 1934 Today's Date: 09/24/2015    History of Present Illness 81 yo F with PMHx of oral cancer who p/w SOB, cough, confusion. Was found at PCP to be hypoxic and hypotensive.  Admitted with bacteremia likely from GU source.     PT Comments    Patient progressing with ambulation distance as well as with using of DME practicing with rollator this session.  Feel it is appropriate for her to improve her community mobility as would need seat for rest to be able to negotiate long distances.  PT to follow till d/c.   Follow Up Recommendations  Home health PT;Supervision/Assistance - 24 hour ([place] aide, [place])     Equipment Recommendations  Other (comment) (rollator)    Recommendations for Other Services       Precautions / Restrictions Precautions Precautions: Fall    Mobility  Bed Mobility Overal bed mobility: Needs Assistance       Supine to sit: Min assist     General bed mobility comments: reaching for help in lifting herself up  Transfers Overall transfer level: Needs assistance Equipment used: 4-wheeled walker Transfers: Sit to/from Stand Sit to Stand: Min assist         General transfer comment: Educated on locking and unlocking rollator and on use of brakes if needed during ambulation, stood with min A for balance  Ambulation/Gait Ambulation/Gait assistance: Museum/gallery curator (Feet): 170 Feet Assistive device: 4-wheeled walker Gait Pattern/deviations: Step-through pattern;Decreased stride length;Narrow base of support;Drifts right/left     General Gait Details: min A mainly for balance, couple of times to maneuver walker around door facing or around chair.  Was distracted looking at room numbers on way back to room to find her room, so occasional help to maintain straight path   Stairs            Wheelchair Mobility    Modified Rankin (Stroke Patients  Only)       Balance     Sitting balance-Leahy Scale: Good       Standing balance-Leahy Scale: Poor Standing balance comment: either UE support or legs braced against bed                    Cognition Arousal/Alertness: Awake/alert Behavior During Therapy: WFL for tasks assessed/performed Overall Cognitive Status: Within Functional Limits for tasks assessed                      Exercises      General Comments General comments (skin integrity, edema, etc.): daughter in room and interested in getting rollator for patient      Pertinent Vitals/Pain Pain Assessment: No/denies pain    Home Living                      Prior Function            PT Goals (current goals can now be found in the care plan section) Progress towards PT goals: Progressing toward goals    Frequency  Min 3X/week    PT Plan Current plan remains appropriate    Co-evaluation             End of Session Equipment Utilized During Treatment: Gait belt;Oxygen Activity Tolerance: Patient limited by fatigue Patient left: in bed;with call bell/phone within reach;with bed alarm set;with family/visitor present     Time: 9417-4081 PT Time Calculation (min) (  ACUTE ONLY): 31 min  Charges:  $Gait Training: 23-37 mins                    G Codes:      Reginia Naas 10/19/2015, 4:40 PM  Magda Kiel, [place] 2015/10/19

## 2015-09-24 NOTE — Progress Notes (Signed)
Internal Medicine Attending:   I saw and examined the patient. I reviewed the resident's note and I agree with the resident's findings and plan as documented in the resident's note. Danielle Mcintyre is feeling well today and has no complaints.  We had a long discussion with Danielle Mcintyre, her daughter (present in room) and son (over telephone) of the results to-date.   One concern is her bacteremia without a clear source, we do have a plan for completing 2 weeks of antibiotics, there is a possibility that her lung findings could be from septic emboli but she is very adamant that she does not want any invasive procedure (TEE) she also is not know to have any known valve disease that would predispose her for endocarditis and has not had fevers and has a low pro calcitonin.  I feel that it would still be reasonable to obtain a TTE today. Another concern is the abdominal imaging findings of possible cirrhosis, we have obtained an INR which is grossly normal, her CBC is also normal, if she does have cirrhosis it is very mild and due to an unclear cause.  We have recommended viral hepatitis testing which Danielle Mcintyre agreed to. She also has a dilated common bile duct of unclear cause, her alk phos is not elevated and she has no abdominal pain so this does not appear to be a cause of cholangitis or choledocholithiasis.  We may consider a RUQ U/S to evalutae The biggest lingering question is if the pulmonary nodules could represent malignancy as well as the nature of the liver nodule.  Danielle Mcintyre was very adamant today that she does not want further workup with any invasive procedure (eg biopsy) or even with further imaging (MRI of liver), if she does have a malignacy she reports that she has had a very full life and would not seek any treatment.  I am still a little unsure of what to make of her multiple findings, her biggest symptoms so far is her weight loss (she is not particually bothered by her hypoxia), I have instructed her  that it is her right to decline further workup and would be reasonable if she would not want to pursue further treatment however she may consider repeating a CT of the chest to reevaluate the lung nodules in about 6-8 weeks after treatment with Abx.

## 2015-09-24 NOTE — Progress Notes (Signed)
PCCM Brief Note  Pt admitted with bacteremia (E.coli and S.viridans).  Has hx of throat CA s/p XRT and radical neck dissection.  Now incidentally found to have pulmonary nodules along with small hepatic nodule.  Given her hx, findings certainly suspicious for malignancy.  Pt not interested in any invasive procedures at this point.    I have set up an outpatient follow up with pulmonology Eric Form, NP) for 10/08/15 at 10:30AM.   Danielle Mcintyre, [place] Pager: [phone]  or [phone] 09/24/2015, 11:19 AM

## 2015-09-25 ENCOUNTER — Inpatient Hospital Stay (HOSPITAL_COMMUNITY): Payer: Medicare Other

## 2015-09-25 DIAGNOSIS — I5189 Other ill-defined heart diseases: Secondary | ICD-10-CM | POA: Diagnosis present

## 2015-09-25 DIAGNOSIS — R7881 Bacteremia: Secondary | ICD-10-CM

## 2015-09-25 DIAGNOSIS — K8020 Calculus of gallbladder without cholecystitis without obstruction: Secondary | ICD-10-CM | POA: Diagnosis present

## 2015-09-25 LAB — ECHOCARDIOGRAM COMPLETE
HEIGHTINCHES: 61 in
Weight: 1662.4 oz

## 2015-09-25 LAB — HEPATITIS C ANTIBODY: HCV Ab: <0.1 s/co ratio (ref 0.0–0.9)

## 2015-09-25 LAB — CULTURE, BLOOD (ROUTINE X 2): Culture: NO GROWTH

## 2015-09-25 LAB — HEPATITIS B CORE ANTIBODY, TOTAL: Hep B Core Total Ab: NEGATIVE

## 2015-09-25 LAB — HEPATITIS A ANTIBODY, TOTAL: HEP A TOTAL AB: NEGATIVE

## 2015-09-25 LAB — CEA: CEA: 13.7 ng/mL — AB (ref 0.0–4.7)

## 2015-09-25 LAB — HEPATITIS B SURFACE ANTIBODY,QUALITATIVE: HEP B S AB: NONREACTIVE

## 2015-09-25 MED ORDER — CEPHALEXIN 250 MG PO CAPS: 250.0000 mg | ORAL_CAPSULE | Freq: Four times a day (QID) | ORAL | 0 refills | Status: AC

## 2015-09-25 MED ORDER — ENSURE ENLIVE PO LIQD: 237.0000 mL | Freq: Three times a day (TID) | ORAL | 12 refills | Status: AC

## 2015-09-25 MED ORDER — RIVAROXABAN 15 MG PO TABS: 15.0000 mg | ORAL_TABLET | Freq: Every day | ORAL | 0 refills | Status: AC

## 2015-09-25 NOTE — Progress Notes (Signed)
  Echocardiogram 2D Echocardiogram has been performed.  Jennette Dubin 09/25/2015, 11:14 AM

## 2015-09-25 NOTE — Care Management Important Message (Signed)
Important Message  Patient Details  Name: Danielle Mcintyre MRN: [id number] Date of Birth: Feb 08, 1934   Medicare Important Message Given:  Yes    [name] 09/25/2015, 12:08 PM

## 2015-09-25 NOTE — Progress Notes (Signed)
Subjective: [name] is feeling well today. She denies any new complaints such as difficulty breathing, chest pain, or palpitations. She and her daughter were found in the room after echo was performed this morning, her son was called on speaker phone and they were all updated on the results. They feel she is ready to go home today.   Objective:  Vital signs in last 24 hours: Vitals:   09/24/15 1013 09/24/15 1451 09/24/15 2056 09/25/15 0506  BP: 104/64 134/62 (!) 124/57 128/70  Pulse: 87 68 61 62  Resp:  '16 18 18  '$ Temp:  98.1 F (36.7 C) 98.2 F (36.8 C) 98.4 F (36.9 C)  TempSrc:  Oral Oral Oral  SpO2:  93% 93% 97%  Weight:    103 lb 14.4 oz (47.1 kg)  Height:        Physical Exam  Constitutional: She appears well-developed and well-nourished. No distress.  Cardiovascular: Normal rate and regular rhythm.   No murmur heard. Pulmonary/Chest: She has no wheezes. She has no rales.  Abdominal: Soft. She exhibits no distension. There is no tenderness.  Decreased breath sounds over left lung fields   extremities; no calf tenderness, no peripheral edema   Labs: CBC:  Recent Labs Lab 09/20/15 1628 09/21/15 0157 09/21/15 0534  WBC 13.6* 9.2 10.4  NEUTROABS 12.2*  --   --   HGB 15.2* 13.0 13.6  HCT 46.3* 40.1 41.6  MCV 93.5 92.6 92.9  PLT 171 710* 626*   Metabolic Panel:  Recent Labs Lab 09/20/15 1628 09/21/15 0157 09/21/15 0534 09/22/15 0324 09/24/15 1225  NA 139 139 139 139  --   K 3.6 3.6 3.5 4.0  --   CL 99* 102 104 105  --   CO2 '31 29 28 27  '$ --   GLUCOSE 112* 86 86 117*  --   BUN 58* 45* 44* 31*  --   CREATININE 1.30* 0.91 0.84 0.66  --   CALCIUM 9.8 8.7* 9.0 8.5*  --   ALT 13*  --   --   --   --   ALKPHOS 74  --   --   --   --   BILITOT 0.7  --   --   --   --   PROT 7.2  --   --   --   --   ALBUMIN 2.8*  --   --   --   --   LABPROT  --   --   --   --  14.3  INR  --   --   --   --  1.10   Microbiology: Blood cultures collected 9/10 - no  growth to date  Blood culture 9/7 e.coli and strept viridians- pan sensitive in 1 of 2 bottles  Urine culture collected 9/7 insignificant growth  Imaging: Echo 09/25/2015 - Left ventricle: The cavity size was normal. There was mild   concentric hypertrophy. Systolic function was normal. The   estimated ejection fraction was in the range of 60% to 65%. Wall   motion was normal; there were no regional wall motion   abnormalities. Doppler parameters are consistent with abnormal   left ventricular relaxation (grade 1 diastolic dysfunction). - Mitral valve: Calcified and thickend mitral annulus and leaflet   tips. Calcified annulus. - Atrial septum: No defect or patent foramen ovale was identified. - Pulmonary arteries: PA peak pressure: 35 mm Hg (S). - Pericardium, extracardiac: Trivial anterior pericardial effusion  Abdominal ultasound  09/24/2015 Common bile duct diameter 4.0 mm. No choledocholithiasis identified. 1. Cholelithiasis. 2. No sonographic Murphy's. 3. Cirrhotic morphology of the liver.   Medications: Infusions:   Scheduled Medications: . cephALEXin  250 mg Oral Q6H  . feeding supplement (ENSURE ENLIVE)  237 mL Oral TID BM  . levothyroxine  75 mcg Oral QAC breakfast  . mouth rinse  15 mL Mouth Rinse BID  . metoprolol succinate  100 mg Oral Daily  . rivaroxaban   Does not apply Once  . rivaroxaban  15 mg Oral Q supper   PRN Medications: albuterol  Assessment/Plan: Pt is a 81 y.o.yo femalewith a PMHx of throat cancer status post radiation therapyand radical neck dissection, hypothyroidism, hypertension, diet-controlled diabeteswho was admitted on 9/7/2017with symptoms of acute respiratory distress and was found to have bacteremia and lung nodules.   Principal Problem:   Bacteremia Active Problems:   Protein-calorie malnutrition, severe   Pressure ulcer   Low TSH level   Liver nodule   Lung nodules  Bacteremia: She remains afebrile. Blood cultures 1 of 2  showedEscherichia coli and Streptococcus viridans. Repeat blood cultures have no growth to date. Urinalysis on admission did have bacteriuria though urine cultures showed insignificant growth. The source is unclear. CT abdomen showed a dilated common bile duct but no obstructing stones, RUQ ultrasound this morning showed 85m common bile duct and cholelithiasis without choledocholithiasis. Echo showed no vegitations or significant valvular abnormalities.  -Continue supplemental oxygen, SpO2 is in the low 90s on 2-3 liters  -She has received 4 days of IV Ceftriaxone IV which was transitioned to keflex 250 mg q 6 hours yesterday with stop date 9/20 to complete a 2 week total course   Lung nodules: CTA revealed pulmonary nodularity suspicious for malignancy, reported 30 pounds weight loss over the last 4 months andprior smoking history. She has a history of throat cancer, CT neck was negative for recurrent disease. CT abdomen showed heterogeneous liver parenchyma with nodularity. INR was within normal limits which is reassuring for good hepatic function. Hepatitis serologies were negative.   Paroxysmal afib Noticed on telemetry, EKG negative for Afib. She has no prior history and remains asymptomatic.   - continue xarelto 15 mg   Prerenal acute kidney injury: Resolved. Creatinine 0.66 on last CMET 9/9, improved from 1.3 on admission.  Hypothyroidism: Continue home levothyroxine 75 g daily.   Dispo: Anticipated discharge today  LOS: 5 days   NLedell Noss MD 09/25/2015, 11:59 AM Pager: [phone]

## 2015-09-25 NOTE — Progress Notes (Signed)
Internal Medicine Attending:   I saw and examined the patient. I reviewed the resident's note and I agree with the resident's findings and plan as documented in the resident's note. She continues to be asymptomatic and in good spirits. Patient and her daughter were updated bedside. Her Abd US revealed cholelithiasis w/o choledocholithiasis and there is no bile duct dilatation. Her Echo does not reveal any vegetations or significant valvular disease predisposing her to endocarditis.  We briefly discussed her case with ID and will discharge her to complete 2 week course of antibiotics with Keflex.  At this time she does not want further workup for malignancy but would consider repeat imaging of her lungs after antibiotics.

## 2015-09-27 DIAGNOSIS — I700 Atherosclerosis of aorta: Secondary | ICD-10-CM

## 2015-09-27 DIAGNOSIS — I480 Paroxysmal atrial fibrillation: Secondary | ICD-10-CM

## 2015-09-27 DIAGNOSIS — K7460 Unspecified cirrhosis of liver: Secondary | ICD-10-CM

## 2015-09-28 LAB — CULTURE, BLOOD (ROUTINE X 2)
CULTURE: NO GROWTH
Culture: NO GROWTH

## 2015-10-08 ENCOUNTER — Inpatient Hospital Stay: Payer: Medicare Other | Admitting: Acute Care

## 2015-11-14 DEATH — deceased

## 2016-12-08 IMAGING — US US ABDOMEN LIMITED
1 series · 14 of 25 positions shown · non-contrast
Comparison: CT of the abdomen and pelvis on 09/23/2015

CLINICAL DATA: Bacteremia. Prior radical neck dissection for throat
cancer.

EXAM:
US ABDOMEN LIMITED - RIGHT UPPER QUADRANT

[Series 1: us abdomen limited · 0.18mm/px · 14 of 51 slices shown]
[im 1/51]
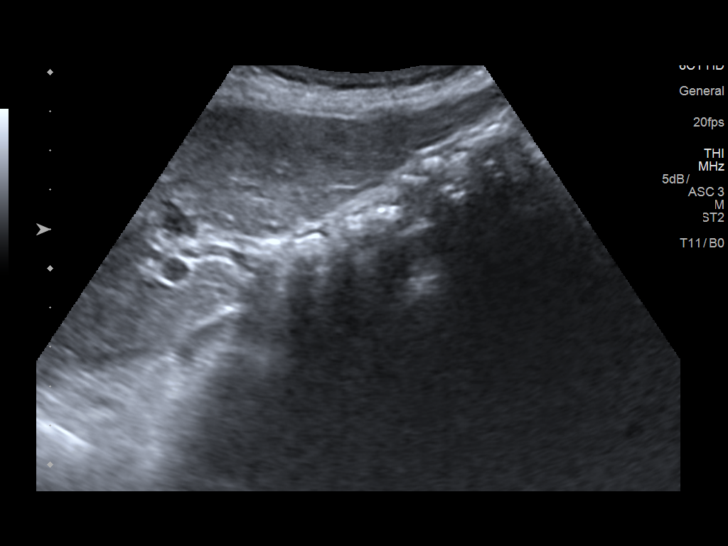
[im 5/51]
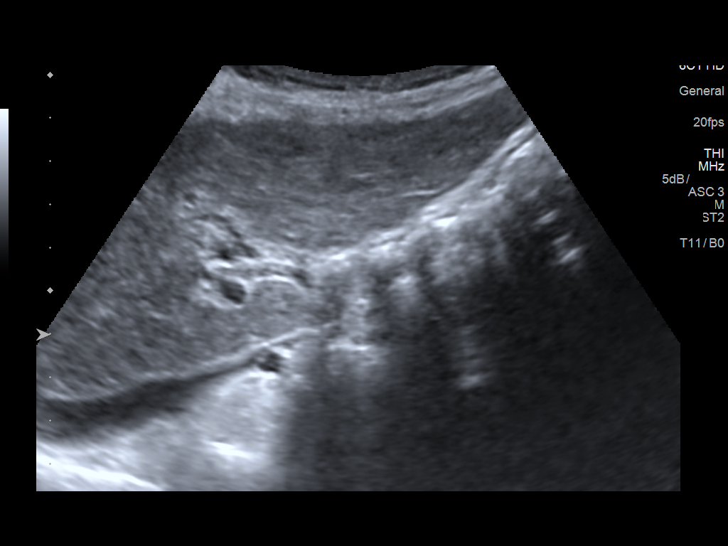
[im 9/51]
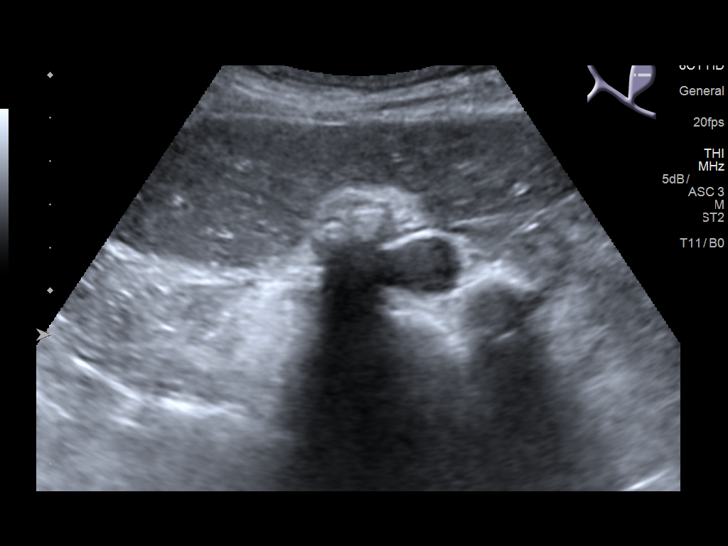
[im 13/51]
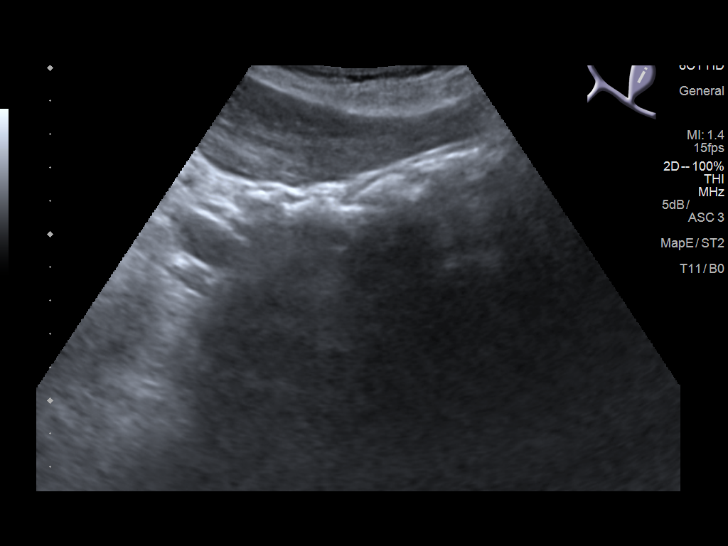
[im 17/51]
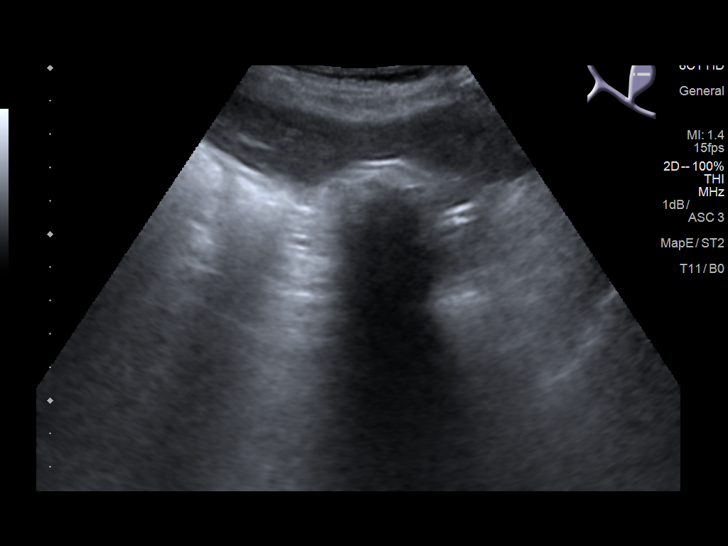
[im 19/51]
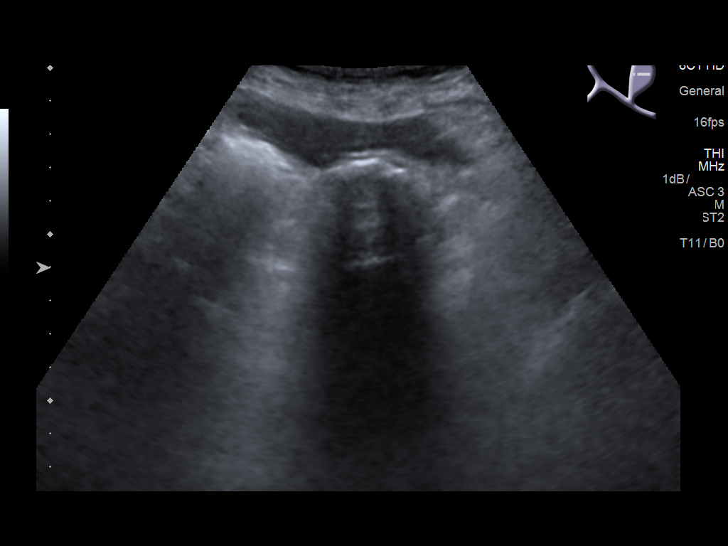
[im 23/51]
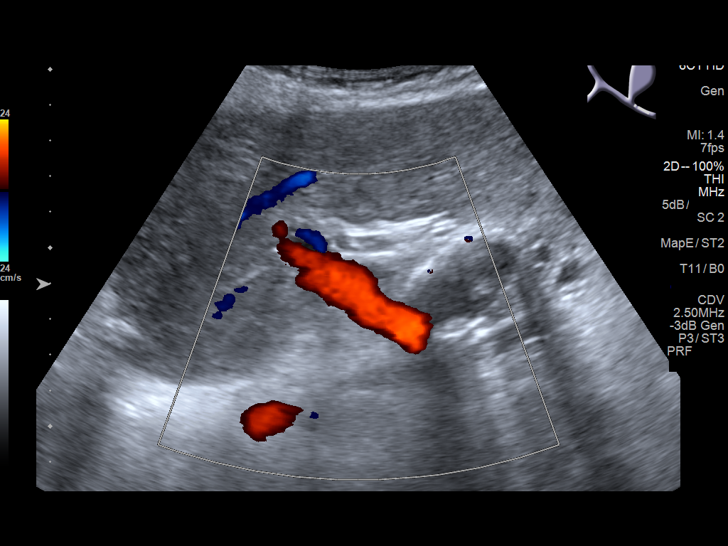
[im 28/51]
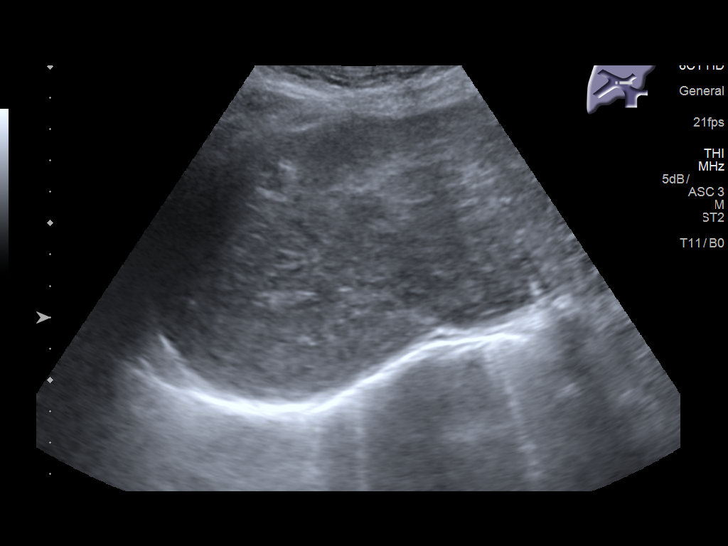
[im 32/51]
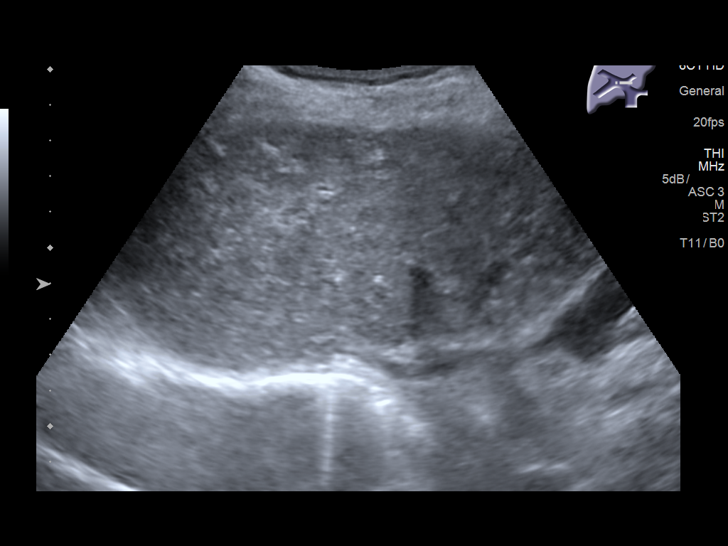
[im 34/51]
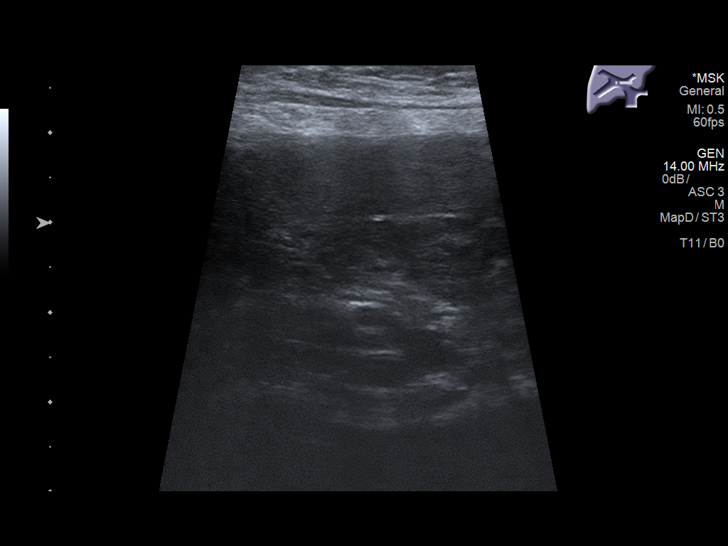
[im 38/51]
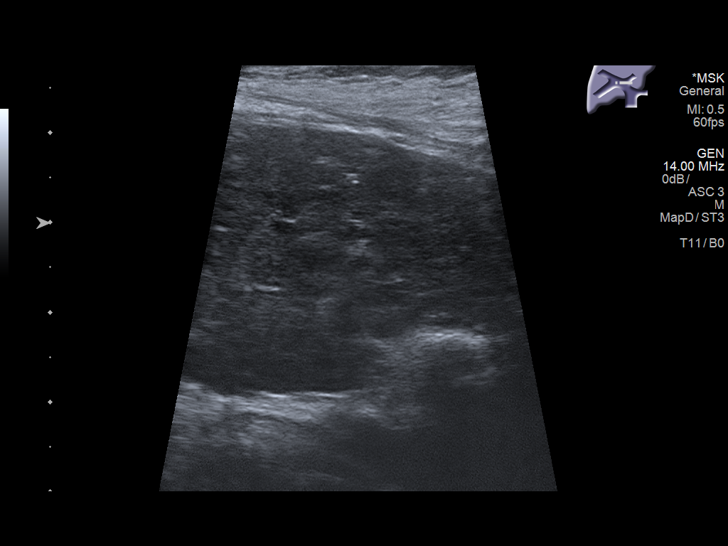
[im 42/51]
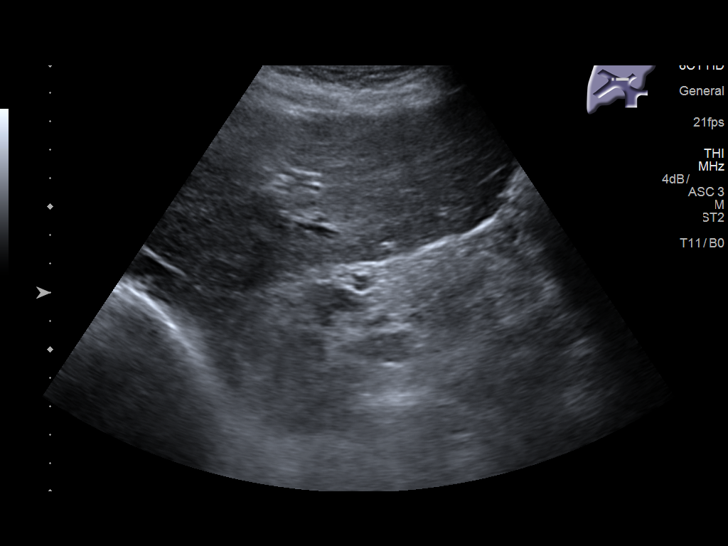
[im 46/51]
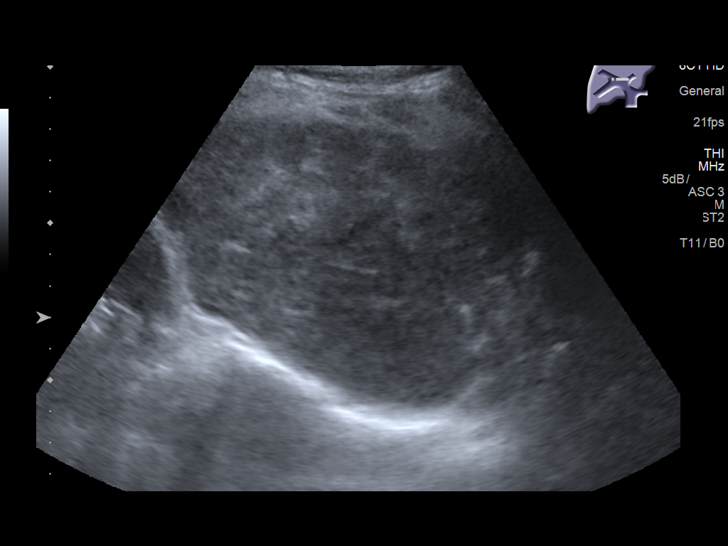
[im 51/51]
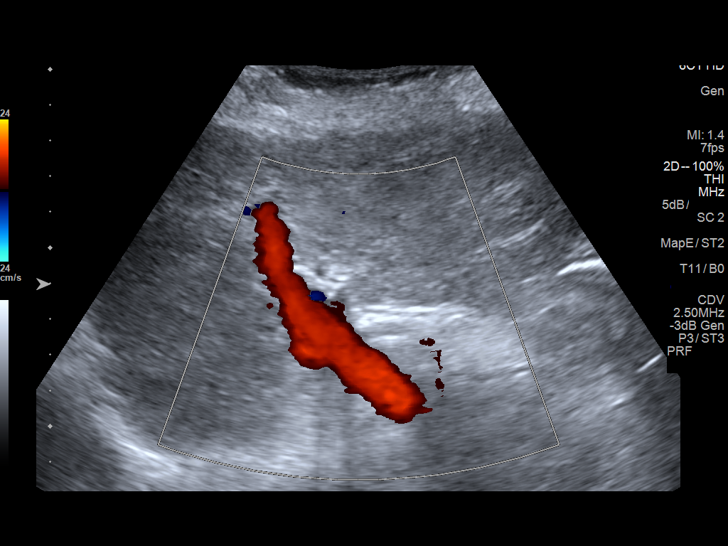

[14 of 25 positions shown; findings below may reference images not displayed]

FINDINGS: Gallbladder:

Multiple stones are identified throughout the gallbladder.
Gallbladder wall is 2.0 mm in thickness. No sonographic Murphy sign.
Largest calculus measured is 8 mm.

Common bile duct:

Diameter: 4.0 mm.  No choledocholithiasis identified.

Liver:

Mildly heterogeneous and nodular in appearance. No focal liver
lesions are identified.
IMPRESSION: 1. Cholelithiasis.
2. No sonographic Elizabethedgina.
3. Cirrhotic morphology of the liver.

## 2018-02-17 IMAGING — CT CT ABD-PELV W/ CM
2 of 5 series · 14 of 46 positions shown, 16 images · IV contrast (APPLIED)
Comparison: None.

CLINICAL DATA: 81-year-old female inpatient with a history of oral
cancer presents with shortness of breath and cough. History of
appendectomy.

EXAM:
CT ABDOMEN AND PELVIS WITH CONTRAST
TECHNIQUE: Multidetector CT imaging of the abdomen and pelvis was performed
using the standard protocol following bolus administration of
intravenous contrast.
CONTRAST:  100mL [id number] IOPAMIDOL ([id number]-FFF) INJECTION 61%

[Series 3: abd/ pelvis 5.0 i30f 1 · axial · 0.64mm/px · z∈[-451,-61]mm · 11 of 88 slices shown, 13 images]
[im 5/88  soft-tissue]
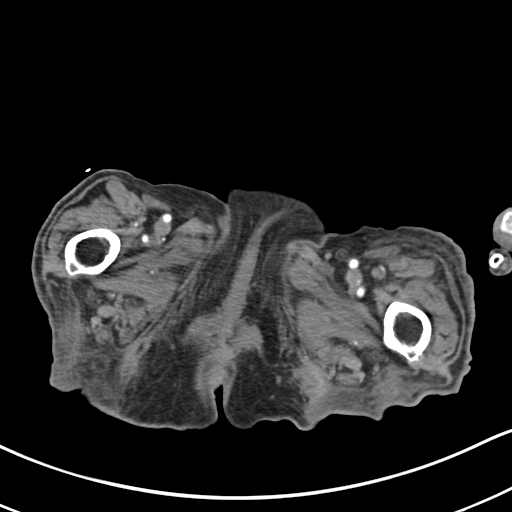
[im 5/88  bone]
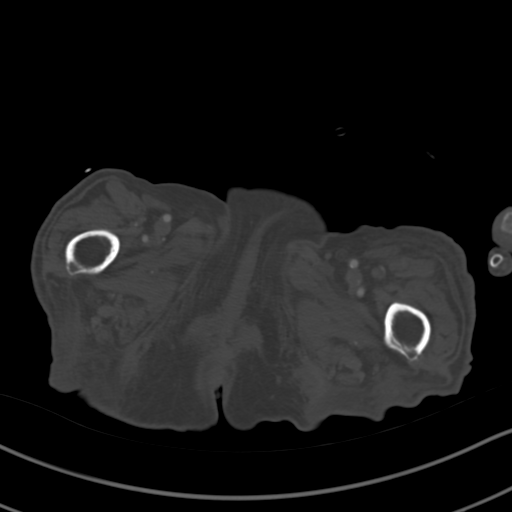
[im 14/88  soft-tissue]
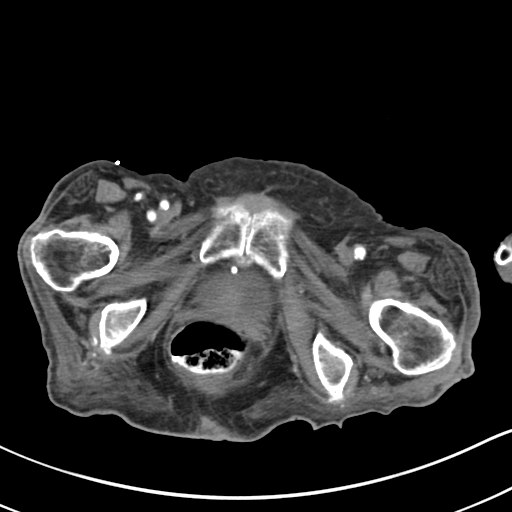
[im 23/88  soft-tissue]
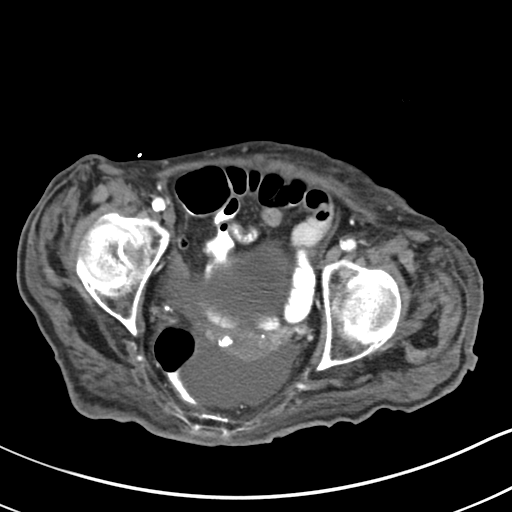
[im 28/88  soft-tissue]
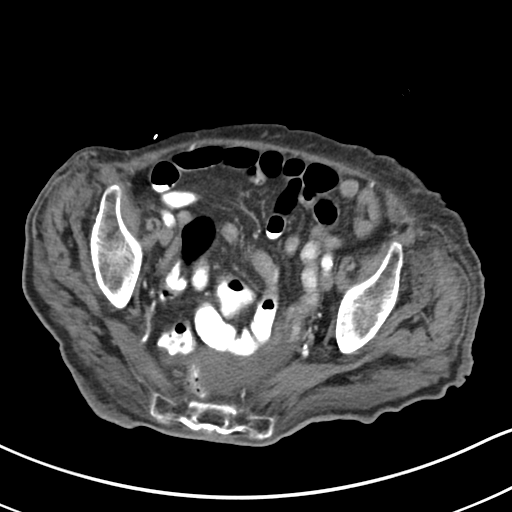
[im 37/88  soft-tissue]
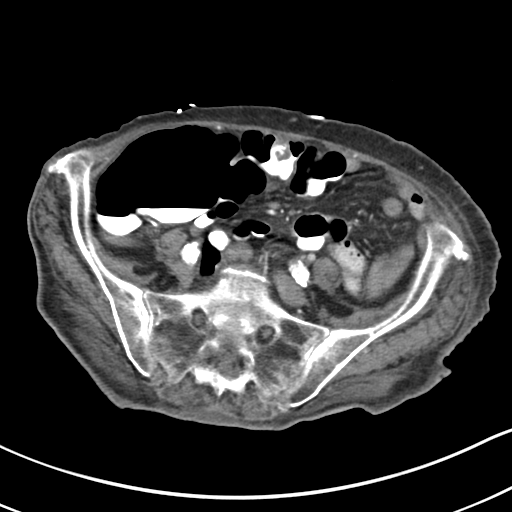
[im 46/88  soft-tissue]
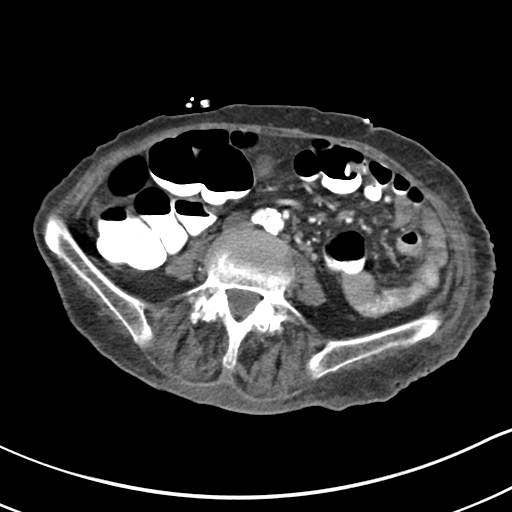
[im 51/88  soft-tissue]
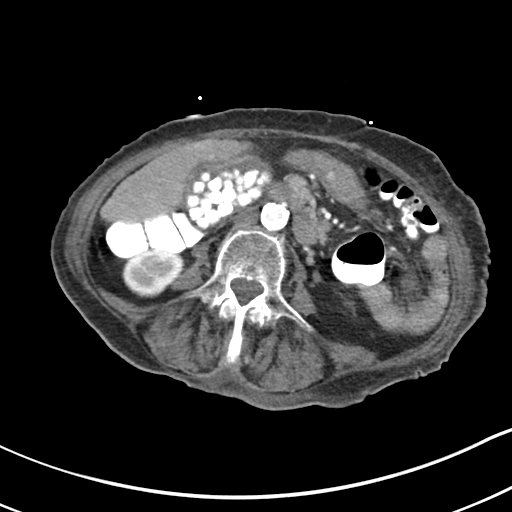
[im 60/88  soft-tissue]
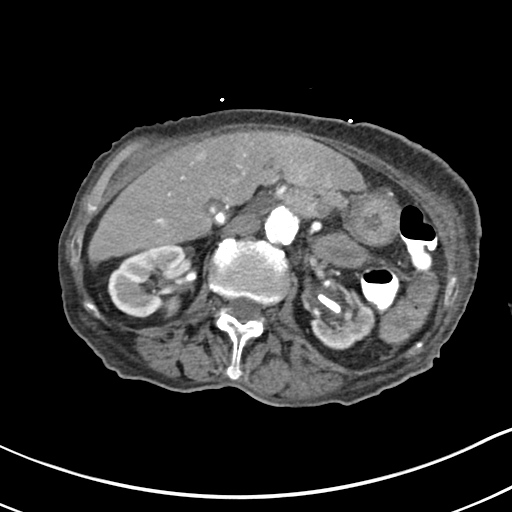
[im 65/88  soft-tissue]
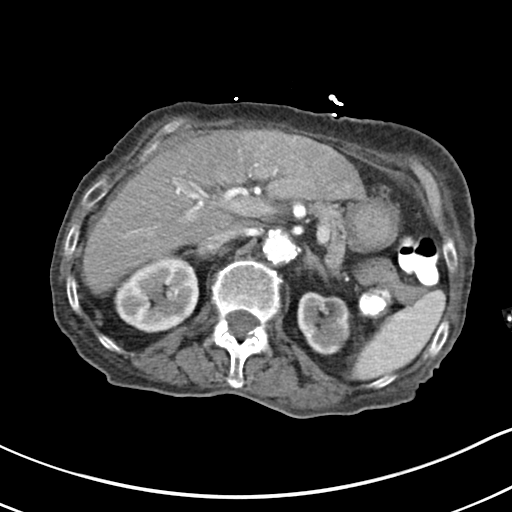
[im 65/88  bone]
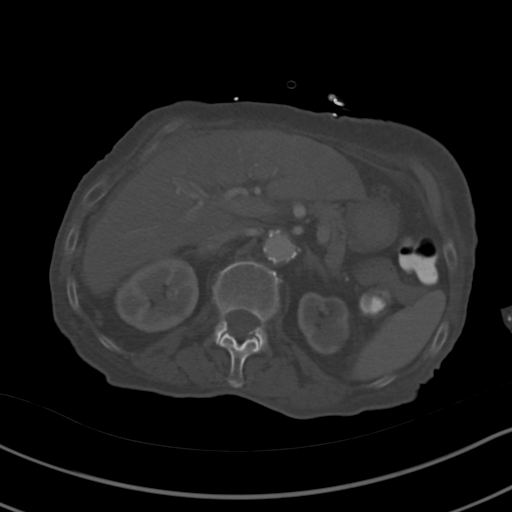
[im 74/88  soft-tissue]
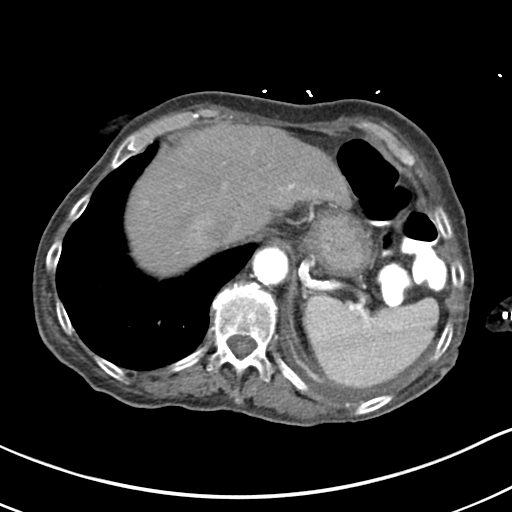
[im 83/88  soft-tissue]
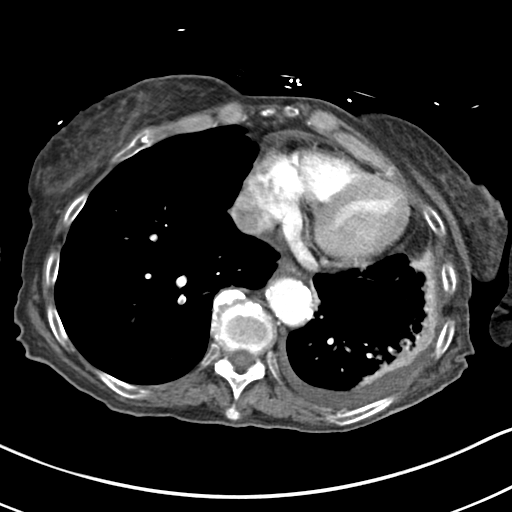

[Series 6: coronal soft tissue · coronal · 0.67mm/px · 3 of 75 slices shown]
[im 25/75  soft-tissue]
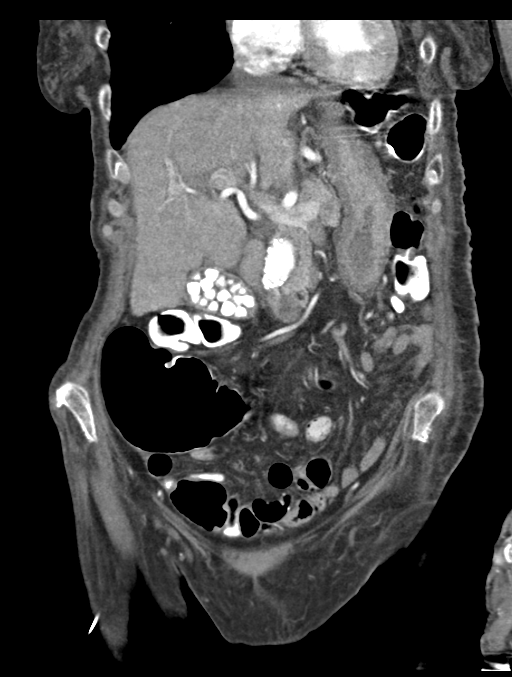
[im 33/75  soft-tissue]
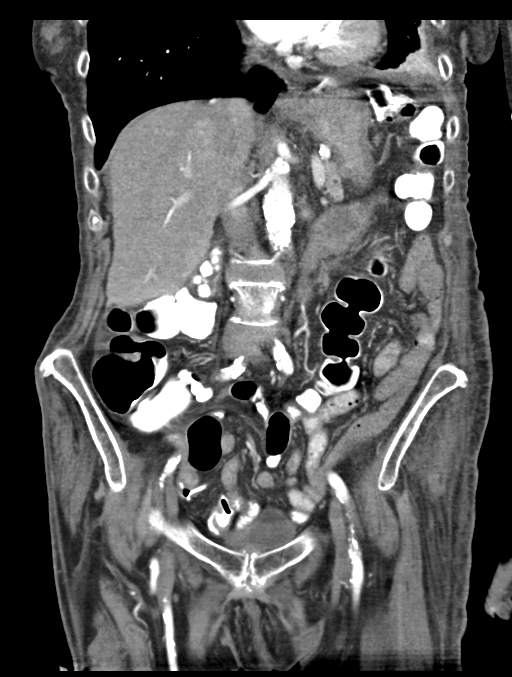
[im 42/75  soft-tissue]
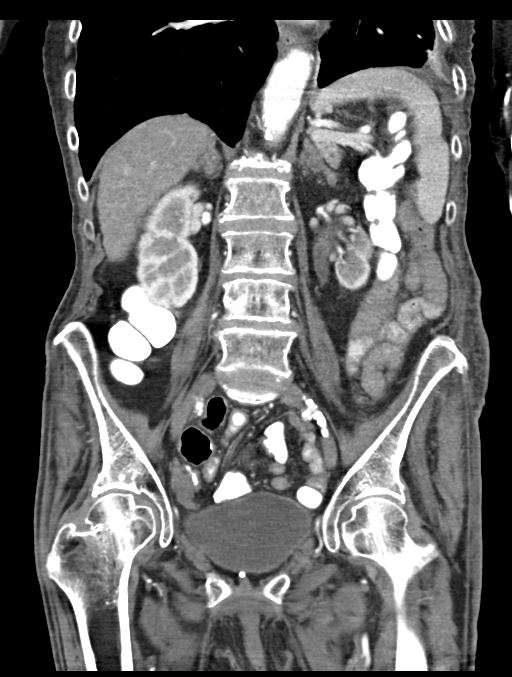

[14 of 46 positions shown; findings below may reference images not displayed]

FINDINGS: Lower chest: Small layering left pleural effusion. Mild atelectasis
in the dependent left lower lobe. Superimposed mild patchy
consolidation and tree-in-bud opacity in the dependent left lower
lobe.

Hepatobiliary: Liver surface is diffusely irregular, consistent with
cirrhosis. There is diffusely heterogeneous liver parenchymal
enhancement on the initial sequence with scattered nodular foci of
apparent hyperenhancement measuring up to 1.2 cm on the left liver
lobe (series 3/image 18), which are indeterminate. Gallbladder is
completely filled with calcified gallstones measuring up to 1.0 cm,
with no gallbladder wall thickening or pericholecystic fluid. No
intrahepatic biliary ductal dilatation. Dilated common bile duct (10
mm diameter), with no radiopaque choledocholithiasis.

Pancreas: Top-normal caliber main pancreatic duct. No pancreatic
mass.

Spleen: Normal size. No mass.

Adrenals/Urinary Tract: Diffuse mild thickening of the adrenal
glands bilaterally without discrete adrenal nodule. There are
adjacent 24 x 15 mm left renal pelvis and 16 x 8 mm lower left renal
collecting system staghorn calculi. No hydronephrosis. Asymmetric
left renal atrophy. Several subcentimeter hypodense renal cortical
lesions scattered throughout both kidneys are too small to
characterize and require no further follow-up. Nonspecific
urothelial thickening in the left renal pelvis. Normal bladder.

Stomach/Bowel: Grossly normal stomach. Normal caliber small bowel
with no small bowel wall thickening. Appendectomy. Normal large
bowel with no diverticulosis, large bowel wall thickening or
pericolonic fat stranding.

Vascular/Lymphatic: Atherosclerotic abdominal aorta with infrarenal
abdominal aortic ectasia, maximum diameter 2.8 cm. Patent portal,
splenic, hepatic and renal veins. No pathologically enlarged lymph
nodes in the abdomen or pelvis.

Reproductive: Grossly normal uterus.  No adnexal mass.

Other: Small volume ascites, predominantly in the deep pelvis. No
pneumoperitoneum.

Musculoskeletal: No aggressive appearing focal osseous lesions.
Moderate thoracolumbar spondylosis.
IMPRESSION: 1. Mild patchy consolidation and tree-in-bud opacity in the
dependent left lower lobe with associated mild atelectasis and small
layering left pleural effusion, suggesting left lower lobe pneumonia
and/or aspiration.
2. Cirrhosis. Diffusely heterogeneous liver parenchymal enhancement
with apparent small hyperenhancing nodular foci, which are
indeterminate for hepatocellular carcinoma. Short term outpatient
evaluation with MRI abdomen without and with IV contrast is
recommended when the patient is able to adequately breath-hold.
3. Small volume ascites.
4. Staghorn left renal calculi. Asymmetric left renal atrophy. No
hydronephrosis. Nonspecific urothelial thickening in the left renal
pelvis, probably represent chronic inflammatory change due to the
left renal stones, however cannot exclude ascending left urinary
tract infection.
5. Cholelithiasis. No intrahepatic biliary ductal dilatation.
Dilated common bile duct. Recommend correlation with serum bilirubin
levels.
6. Aortic atherosclerosis. Infrarenal abdominal aortic ectasia,
maximum diameter 2.8 cm. Ectatic abdominal aorta at risk for
aneurysm development. Recommend followup by ultrasound in 5 years.
This recommendation follows ACR consensus guidelines: White Paper of
the ACR Incidental Findings Committee II on Vascular Findings. [HOSPITAL] 3314; [DATE].

## 2018-02-17 IMAGING — CT CT NECK W/ CM
3 of 5 series · 12 of 33 positions shown, 14 images · IV contrast (APPLIED)
Comparison: None.

CLINICAL DATA: Past medical history of oral cancer. Increasing
productive cough for a week.

EXAM:
CT NECK WITH CONTRAST
TECHNIQUE: Multidetector CT imaging of the neck was performed using the
standard protocol following the bolus administration of intravenous
contrast.
CONTRAST:  100mL [id number] IOPAMIDOL ([id number]) INJECTION 61%

[Series 6: coronal st · coronal · 0.35mm/px · 3 of 106 slices shown]
[im 22/106  bone]
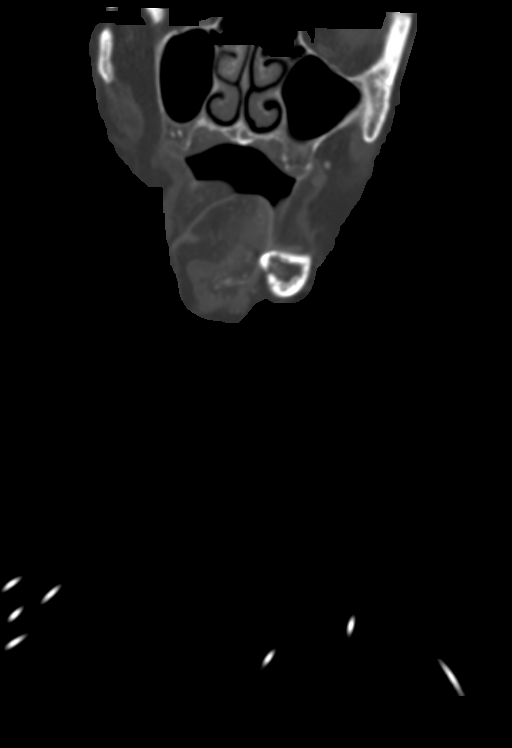
[im 43/106  bone]
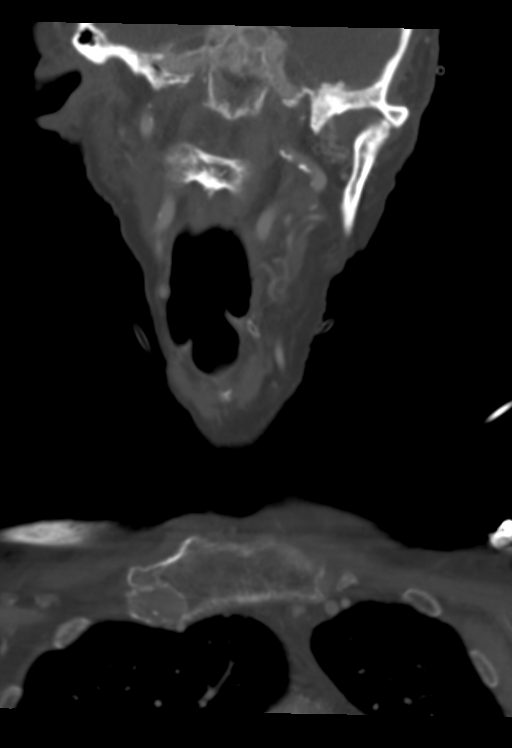
[im 64/106  bone]
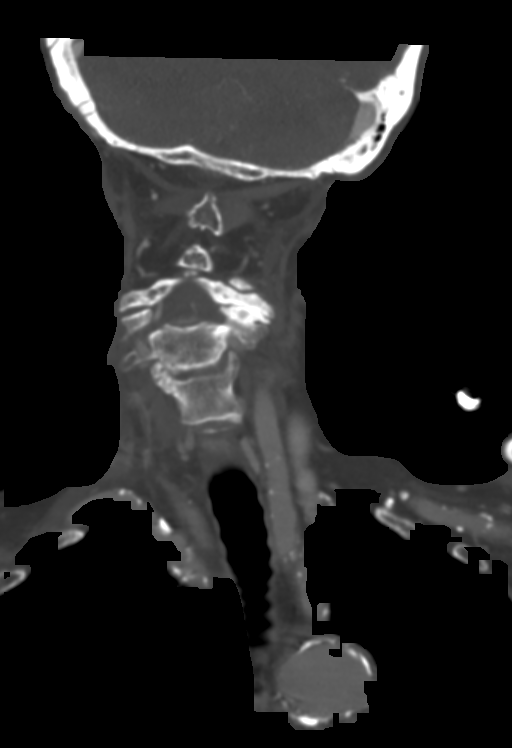

[Series 7: sagittal st · sagittal · 0.41mm/px · 5 of 90 slices shown, 6 images]
[im 30/90  bone]
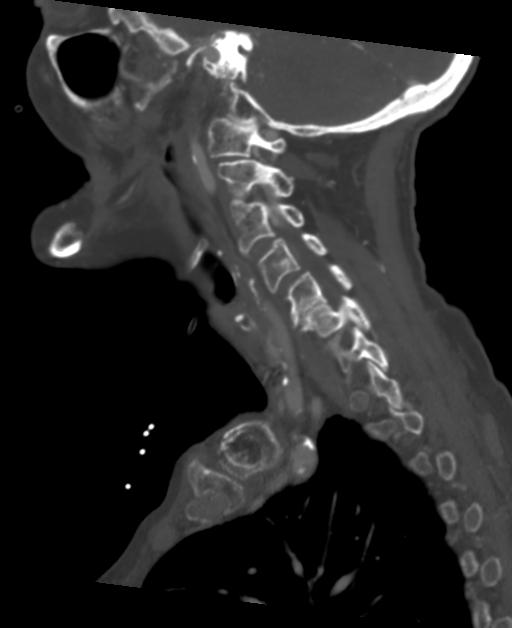
[im 38/90  bone]
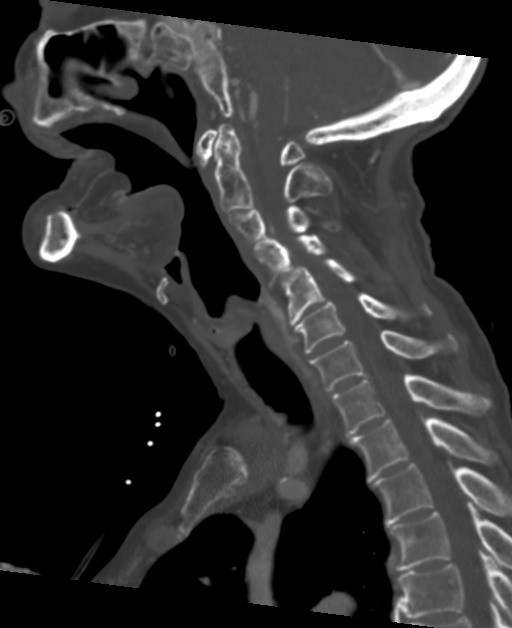
[im 45/90  soft-tissue]
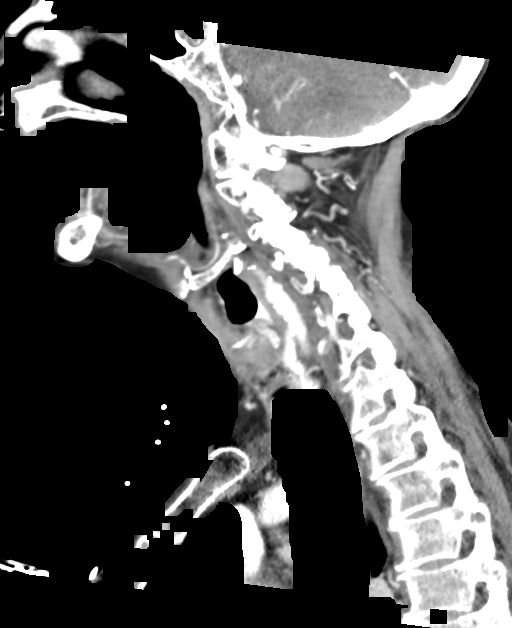
[im 45/90  bone]
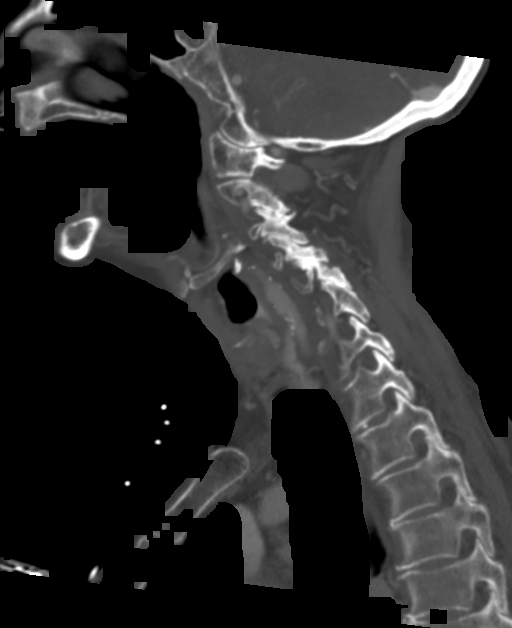
[im 52/90  bone]
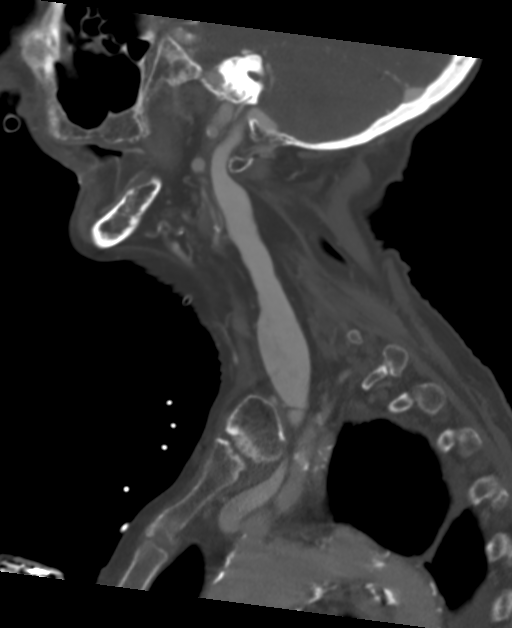
[im 60/90  bone]
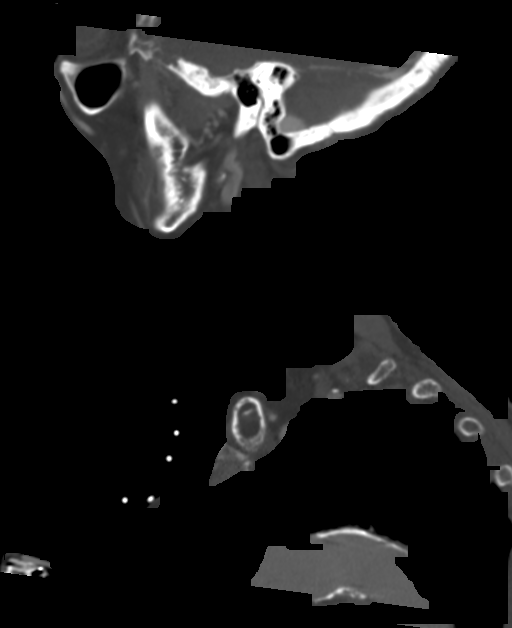

[Series 8: orthogonal st · axial · 0.35mm/px · z∈[+68,+224]mm · 4 of 126 slices shown, 5 images]
[im 26/126  soft-tissue]
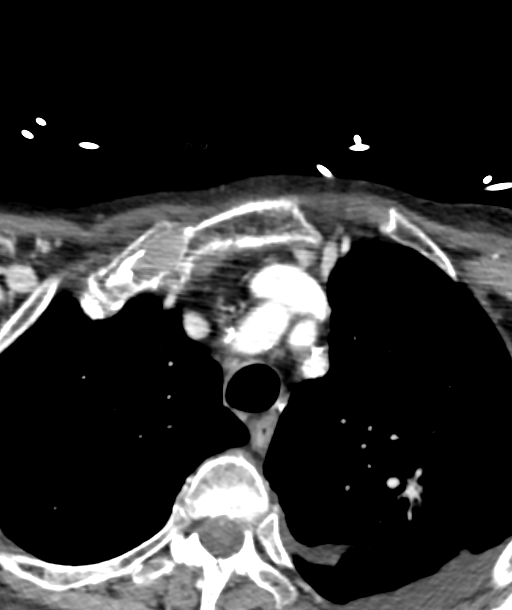
[im 26/126  bone]
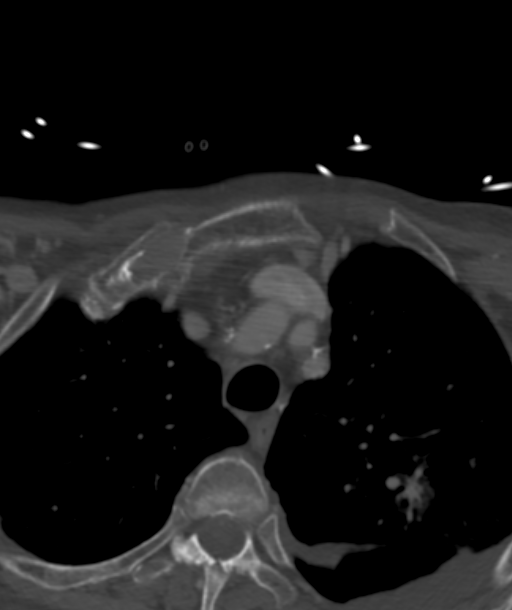
[im 51/126  bone]
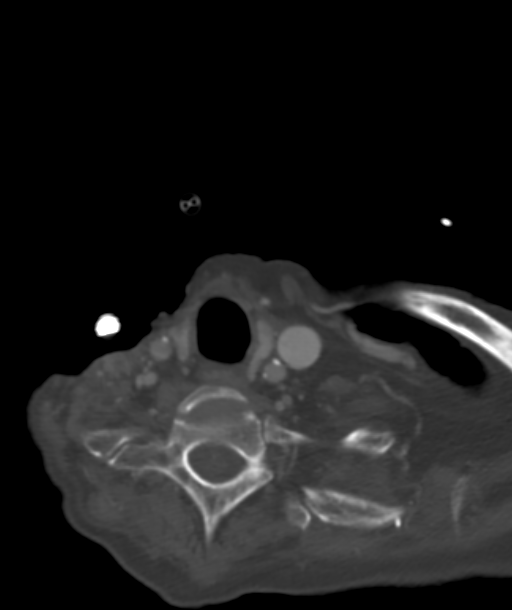
[im 76/126  bone]
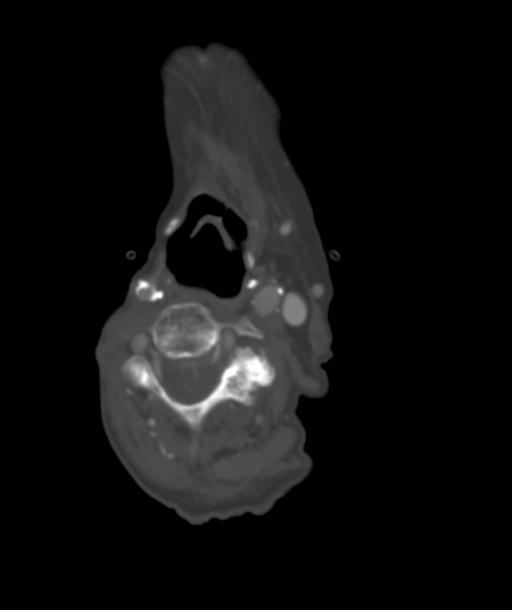
[im 101/126  bone]
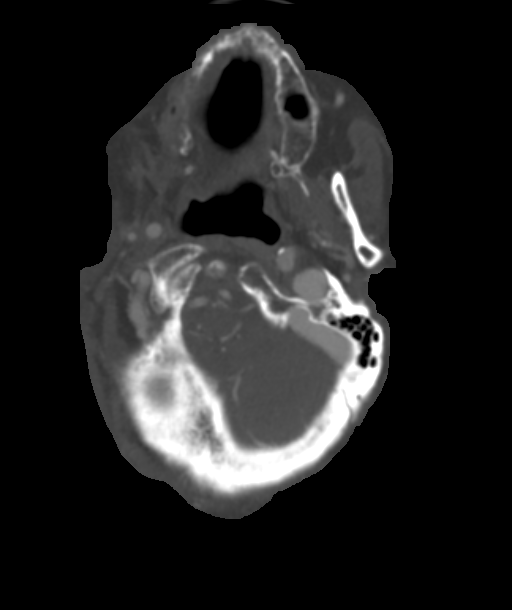

[12 of 33 positions shown; findings below may reference images not displayed]

FINDINGS: Pharynx and larynx: Status post partial glossectomy with possible
flap. Right hemimandibulectomy and radical right neck dissection
changes. No comparison imaging to document stability, but no
suspicious nodular enhancement to suggest residual or recurrent
disease.

Oral contrast administered for abdomen pelvis CT. Contrast is seen
layering in the hypopharynx and supraglottic larynx.

Salivary glands: Diffuse post treatment atrophy with right
submandibular resection.

Thyroid: Atrophic.

Lymph nodes: No adenopathy.

Vascular: Extensive atherosclerotic calcification with moderate
proximal right ICA mid stenosis. Sacrificed right jugular veins.

Limited intracranial: No acute finding.

Visualized orbits: Negative

Mastoids and visualized paranasal sinuses: Clear

Skeleton: Right hemimandibulectomy. Diffuse degenerative changes in
the cervical spine. Prominent arachnoid granulations in the left
middle cranial fossa.

Upper chest: Recent full chest CT. There is emphysema and a solid
and sub solid 
 2 cm left upper lobe nodule. Trace layering left
pleural effusion. Dependent reticular nodular opacity in the
superior segment left lower lobe and posterior segment left upper
lobe which could reflect acute pneumonia/aspiration pneumonitis.
IMPRESSION: 1. Treated oral cancer without evidence of recurrent disease.
2. Oral contrast administered for contemporaneous abdominal CT shows
hypopharyngeal stasis and cord level laryngeal penetration.
3. Known probable left upper lobe bronchogenic carcinoma. Other left
lung opacities may reflect aspiration pneumonia given #2.
